# Patient Record
Sex: Male | Born: 1955 | Race: White | Hispanic: No | Smoking: Former smoker
Health system: Southern US, Community
[De-identification: ages and names within clinical notes are randomized; demographics above are authoritative.]

## PROBLEM LIST (undated history)

## (undated) DIAGNOSIS — M1990 Unspecified osteoarthritis, unspecified site: Secondary | ICD-10-CM

## (undated) DIAGNOSIS — F419 Anxiety disorder, unspecified: Secondary | ICD-10-CM

## (undated) DIAGNOSIS — S61219A Laceration without foreign body of unspecified finger without damage to nail, initial encounter: Secondary | ICD-10-CM

## (undated) DIAGNOSIS — N529 Male erectile dysfunction, unspecified: Secondary | ICD-10-CM

## (undated) DIAGNOSIS — I10 Essential (primary) hypertension: Secondary | ICD-10-CM

## (undated) DIAGNOSIS — F32A Depression, unspecified: Secondary | ICD-10-CM

## (undated) DIAGNOSIS — J683 Other acute and subacute respiratory conditions due to chemicals, gases, fumes and vapors: Secondary | ICD-10-CM

## (undated) DIAGNOSIS — J309 Allergic rhinitis, unspecified: Secondary | ICD-10-CM

## (undated) DIAGNOSIS — F329 Major depressive disorder, single episode, unspecified: Secondary | ICD-10-CM

## (undated) DIAGNOSIS — J449 Chronic obstructive pulmonary disease, unspecified: Secondary | ICD-10-CM

## (undated) DIAGNOSIS — C801 Malignant (primary) neoplasm, unspecified: Secondary | ICD-10-CM

## (undated) DIAGNOSIS — F439 Reaction to severe stress, unspecified: Secondary | ICD-10-CM

## (undated) DIAGNOSIS — I878 Other specified disorders of veins: Secondary | ICD-10-CM

## (undated) DIAGNOSIS — J189 Pneumonia, unspecified organism: Secondary | ICD-10-CM

## (undated) DIAGNOSIS — G4700 Insomnia, unspecified: Secondary | ICD-10-CM

## (undated) HISTORY — DX: Allergic rhinitis, unspecified: J30.9

## (undated) HISTORY — DX: Male erectile dysfunction, unspecified: N52.9

## (undated) HISTORY — DX: Anxiety disorder, unspecified: F41.9

## (undated) HISTORY — PX: ROTATOR CUFF REPAIR: SHX139

## (undated) HISTORY — DX: Essential (primary) hypertension: I10

## (undated) HISTORY — DX: Other acute and subacute respiratory conditions due to chemicals, gases, fumes and vapors: J68.3

## (undated) HISTORY — PX: OTHER SURGICAL HISTORY: SHX169

## (undated) HISTORY — DX: Insomnia, unspecified: G47.00

## (undated) HISTORY — DX: Reaction to severe stress, unspecified: F43.9

---

## 2006-12-15 HISTORY — PX: COLONOSCOPY: SHX174

## 2007-10-07 ENCOUNTER — Ambulatory Visit (HOSPITAL_COMMUNITY): Admission: RE | Admit: 2007-10-07 | Discharge: 2007-10-07 | Payer: Self-pay | Admitting: Internal Medicine

## 2007-10-07 ENCOUNTER — Ambulatory Visit: Payer: Self-pay | Admitting: Internal Medicine

## 2011-04-29 NOTE — Op Note (Signed)
NAME:  Ronald Juarez, Ronald Juarez                 ACCOUNT NO.:  [id number]   MEDICAL RECORD NO.:  [id number]          PATIENT TYPE:  AMB   LOCATION:  DAY                           FACILITY:  [facility]   PHYSICIAN:  R. Roetta Sessions, M.D. DATE OF BIRTH:  1956-10-24   DATE OF PROCEDURE:  10/07/2007  DATE OF DISCHARGE:                               OPERATIVE REPORT   PROCEDURE:  Colonoscopy screening.   INDICATIONS FOR PROCEDURE:  The patient is a 55 year old gentleman with  no lower GI tract symptoms sent over out of the courtesy of Dr. Lubertha South.  [name] has never his lower GI tract evaluated.  He has no  GI symptoms.  Family history is significant in that his half brother had  a laparotomy for colorectal cancer diagnosed at age 95.  Colonoscopy is  now being done as screening maneuver.  This approach has been discussed  with the patient at length.  Potential risks, benefits and alternatives  have been reviewed, questions were answered.  He is agreeable.  Please  see documentation on the medical record.   PROCEDURE NOTE:  O2 saturation, blood pressure, pulse and respirations  were monitored throughout the entire procedure.   CONSCIOUS SEDATION:  Versed 6 mg IV, Demerol 100 mg IV in divided doses.   INSTRUMENT:  Pentax video chip system.   FINDINGS:  Digital rectal exam revealed no abnormalities.   ENDOSCOPIC FINDINGS:  The prep was adequate.   Colon:  Colonic mucosa was surveyed from rectosigmoid junction through  the left, transverse and right colon to the area of appendiceal orifice,  ileocecal valve and cecum.  These structures were well seen and  photographed for the record.  From this level the scope was slowly  withdrawn.  All previously mentioned mucosal surfaces were again seen.  Careful examination of the mucosa utilizing tip flexion and full  flattening to see the mucosa very well.  The patient was noted to have  left-sided diverticula.  Remainder of colonic mucosa appeared  normal.  Scope was pulled down in the rectum where thorough examination of the  rectal mucosa including retroflexed view of the anal verge demonstrated  no abnormalities.  The patient tolerated the procedure well as reactive  to endoscopy.   IMPRESSION:  1. Normal rectum.  2. Left-sided diverticula.  Remainder of colonic mucosa appeared      normal.   RECOMMENDATIONS:  1. Diverticulosis literature given to [name].  2. Would consider having him return for screening colonoscopy somewhat      early, i.e., five years from now.      Jonathon Bellows, M.D.  Electronically Signed     RMR/MEDQ  D:  10/07/2007  T:  10/08/2007  Job:  [id number]   cc:   Lorin Picket A. Gerda Diss, MD  Fax: [phone]

## 2012-09-02 ENCOUNTER — Encounter: Payer: Self-pay | Admitting: *Deleted

## 2013-02-23 ENCOUNTER — Encounter: Payer: Self-pay | Admitting: *Deleted

## 2013-02-26 ENCOUNTER — Encounter: Payer: Self-pay | Admitting: *Deleted

## 2013-03-01 ENCOUNTER — Encounter: Payer: Self-pay | Admitting: Family Medicine

## 2013-03-02 ENCOUNTER — Encounter: Payer: Self-pay | Admitting: Family Medicine

## 2013-03-07 ENCOUNTER — Encounter: Payer: Self-pay | Admitting: Family Medicine

## 2013-03-07 ENCOUNTER — Ambulatory Visit (INDEPENDENT_AMBULATORY_CARE_PROVIDER_SITE_OTHER): Payer: 59 | Admitting: Family Medicine

## 2013-03-07 VITALS — BP 122/70 | HR 70 | Ht 72.0 in | Wt 253.0 lb

## 2013-03-07 DIAGNOSIS — F329 Major depressive disorder, single episode, unspecified: Secondary | ICD-10-CM

## 2013-03-07 DIAGNOSIS — Z125 Encounter for screening for malignant neoplasm of prostate: Secondary | ICD-10-CM

## 2013-03-07 DIAGNOSIS — I10 Essential (primary) hypertension: Secondary | ICD-10-CM

## 2013-03-07 DIAGNOSIS — J309 Allergic rhinitis, unspecified: Secondary | ICD-10-CM

## 2013-03-07 DIAGNOSIS — J45909 Unspecified asthma, uncomplicated: Secondary | ICD-10-CM

## 2013-03-07 DIAGNOSIS — N529 Male erectile dysfunction, unspecified: Secondary | ICD-10-CM

## 2013-03-07 DIAGNOSIS — R5381 Other malaise: Secondary | ICD-10-CM

## 2013-03-07 DIAGNOSIS — F411 Generalized anxiety disorder: Secondary | ICD-10-CM

## 2013-03-07 DIAGNOSIS — F32A Depression, unspecified: Secondary | ICD-10-CM

## 2013-03-07 DIAGNOSIS — E785 Hyperlipidemia, unspecified: Secondary | ICD-10-CM

## 2013-03-07 LAB — BASIC METABOLIC PANEL
Calcium: 9.4 mg/dL (ref 8.4–10.5)
Sodium: 139 mEq/L (ref 135–145)

## 2013-03-07 LAB — HEPATIC FUNCTION PANEL
AST: 20 U/L (ref 0–37)
Albumin: 4.5 g/dL (ref 3.5–5.2)
Alkaline Phosphatase: 66 U/L (ref 39–117)
Bilirubin, Direct: 0.1 mg/dL (ref 0.0–0.3)
Total Bilirubin: 0.5 mg/dL (ref 0.3–1.2)

## 2013-03-07 LAB — LIPID PANEL
HDL: 39 mg/dL — ABNORMAL LOW (ref >39)
LDL Cholesterol: 104 mg/dL — ABNORMAL HIGH (ref 0–99)
Total CHOL/HDL Ratio: 4.3 Ratio

## 2013-03-07 MED ORDER — VALACYCLOVIR HCL 500 MG PO TABS: ORAL_TABLET | ORAL | Status: DC

## 2013-03-07 MED ORDER — AMLODIPINE BESY-BENAZEPRIL HCL 10-20 MG PO CAPS: 1.0000 | ORAL_CAPSULE | Freq: Every day | ORAL | Status: DC

## 2013-03-07 MED ORDER — SILDENAFIL CITRATE 100 MG PO TABS: 100.0000 mg | ORAL_TABLET | Freq: Every day | ORAL | Status: DC | PRN

## 2013-03-07 MED ORDER — ALBUTEROL SULFATE HFA 108 (90 BASE) MCG/ACT IN AERS
2.0000 | INHALATION_SPRAY | Freq: Four times a day (QID) | RESPIRATORY_TRACT | Status: DC | PRN
Start: 2013-03-07 — End: 2014-03-27

## 2013-03-07 MED ORDER — BUPROPION HCL ER (SR) 150 MG PO TB12: 150.0000 mg | ORAL_TABLET | Freq: Two times a day (BID) | ORAL | Status: DC

## 2013-03-07 NOTE — Progress Notes (Signed)
  Subjective:    Patient ID: Ronald Juarez, male    DOB: 05/26/1956, 57 y.o.   MRN: [id number]  HPI  Taking all meds faithfully. Exercising regularly til weather got bad.energy level down. Gaining weight and frustrated. Still needs viagra for ed. Blood pressures are generally good 118 systolic usually.  Review of Systems  Constitutional: Positive for fatigue.  Respiratory: Negative.   Neurological: Negative for tremors.       Objective:   Physical Exam Alert no acute distress. HEENT normal. Lungs clear. Heart rare rhythm. Abdomen benign. Prostate normal. Extremities no edema pulses good. Neuro intact. Skin normal. Vitals reviewed.       Assessment & Plan:  Impression annual physical. #2 hypertension good control #3 reactive airways stable. #4 depression clinically stable. Plan as per orders check every 6 months.

## 2013-03-09 DIAGNOSIS — I10 Essential (primary) hypertension: Secondary | ICD-10-CM | POA: Insufficient documentation

## 2013-03-09 DIAGNOSIS — F411 Generalized anxiety disorder: Secondary | ICD-10-CM | POA: Insufficient documentation

## 2013-03-09 DIAGNOSIS — J309 Allergic rhinitis, unspecified: Secondary | ICD-10-CM | POA: Insufficient documentation

## 2013-03-11 ENCOUNTER — Other Ambulatory Visit: Payer: Self-pay | Admitting: Family Medicine

## 2013-03-24 ENCOUNTER — Telehealth: Payer: Self-pay | Admitting: Family Medicine

## 2013-03-24 NOTE — Telephone Encounter (Signed)
Patient would like to know if we can mail Blood Work results in the mail.

## 2013-03-25 NOTE — Telephone Encounter (Signed)
What bw results? Did he mean bw request?

## 2013-03-25 NOTE — Telephone Encounter (Signed)
Yes send copy of all to patient. All blood work fine except borderline cholesterol results.

## 2013-03-25 NOTE — Telephone Encounter (Signed)
Blood work results from 03/07/13 listed under labs in chart review.

## 2013-03-25 NOTE — Telephone Encounter (Signed)
Results mailed to patient;.

## 2013-06-29 ENCOUNTER — Ambulatory Visit (INDEPENDENT_AMBULATORY_CARE_PROVIDER_SITE_OTHER): Payer: 59 | Admitting: Family Medicine

## 2013-06-29 ENCOUNTER — Encounter: Payer: Self-pay | Admitting: Family Medicine

## 2013-06-29 VITALS — BP 138/82 | Wt 259.8 lb

## 2013-06-29 DIAGNOSIS — R21 Rash and other nonspecific skin eruption: Secondary | ICD-10-CM

## 2013-06-29 MED ORDER — EPINEPHRINE 0.3 MG/0.3ML IJ SOAJ: 0.3000 mg | Freq: Once | INTRAMUSCULAR | Status: DC

## 2013-06-29 MED ORDER — PREDNISONE 20 MG PO TABS: ORAL_TABLET | ORAL | Status: DC

## 2013-06-29 NOTE — Progress Notes (Signed)
  Subjective:    Patient ID: Ronald Juarez, male    DOB: September 16, 1956, 57 y.o.   MRN: [id number]  HPI Set of stings few wks ago with very sig erythroderma, itching, and upper lip swelled up.  No sob no cp then.  Stung ear and arms. Sig swelling with itching and swelling left forearm and right ear.  Deep burning pain Review of Systems No trouble breathing no swollen throat no wheezing ROS otherwise negative    Objective:   Physical Exam  Alert no acute distress lungs clear. Heart regular rate and rhythm. H&T normal. Left arm swelling significant.      Assessment & Plan:   impression significant local reaction secondary to stings angioedema one month ago plan EpiPen prescribed proper use discussed. Prednisone taper.

## 2013-07-28 ENCOUNTER — Ambulatory Visit (INDEPENDENT_AMBULATORY_CARE_PROVIDER_SITE_OTHER): Payer: 59 | Admitting: Family Medicine

## 2013-07-28 ENCOUNTER — Encounter: Payer: Self-pay | Admitting: Family Medicine

## 2013-07-28 VITALS — BP 144/98 | Ht 73.5 in | Wt 261.0 lb

## 2013-07-28 DIAGNOSIS — F411 Generalized anxiety disorder: Secondary | ICD-10-CM

## 2013-07-28 DIAGNOSIS — J683 Other acute and subacute respiratory conditions due to chemicals, gases, fumes and vapors: Secondary | ICD-10-CM

## 2013-07-28 DIAGNOSIS — S86911A Strain of unspecified muscle(s) and tendon(s) at lower leg level, right leg, initial encounter: Secondary | ICD-10-CM

## 2013-07-28 DIAGNOSIS — I10 Essential (primary) hypertension: Secondary | ICD-10-CM

## 2013-07-28 DIAGNOSIS — F329 Major depressive disorder, single episode, unspecified: Secondary | ICD-10-CM

## 2013-07-28 DIAGNOSIS — J45909 Unspecified asthma, uncomplicated: Secondary | ICD-10-CM

## 2013-07-28 DIAGNOSIS — F32A Depression, unspecified: Secondary | ICD-10-CM

## 2013-07-28 MED ORDER — BUPROPION HCL ER (SR) 150 MG PO TB12: 150.0000 mg | ORAL_TABLET | Freq: Two times a day (BID) | ORAL | Status: DC

## 2013-07-28 MED ORDER — SILDENAFIL CITRATE 100 MG PO TABS: ORAL_TABLET | ORAL | Status: DC

## 2013-07-28 MED ORDER — AMLODIPINE BESY-BENAZEPRIL HCL 10-20 MG PO CAPS: 1.0000 | ORAL_CAPSULE | Freq: Every day | ORAL | Status: DC

## 2013-07-28 MED ORDER — IBUPROFEN 800 MG PO TABS: 800.0000 mg | ORAL_TABLET | Freq: Two times a day (BID) | ORAL | Status: DC | PRN

## 2013-07-28 MED ORDER — VALACYCLOVIR HCL 500 MG PO TABS: ORAL_TABLET | ORAL | Status: DC

## 2013-07-28 NOTE — Progress Notes (Signed)
  Subjective:    Patient ID: Ronald Juarez, male    DOB: 1956-09-10, 57 y.o.   MRN: [id number]  Hypertension This is a chronic problem. The current episode started more than 1 year ago. The problem is uncontrolled. Past treatments include ACE inhibitors and calcium channel blockers. The current treatment provides moderate improvement. Compliance problems include exercise.    Exercising with the job, routine has fell offf  Anxiety overall under decent control, compliant with medications. Definitely helping.  Knee squatting down, picke up a large tire, felt a pain. Took med and it helped, took some ibuprofen.  Still uses an inhaler intermittently but not every day Review of Systems No chest pain no abdominal pain no back pain no change in bowel habits    Objective:   Physical Exam Alert no acute distress. Blood pressure good on repeat 136/86. Lungs clear. Heart regular rate and rhythm. Right knee slight crepitations no effusion no joint laxity no point tenderness good range of motion ankles without edema  Mental status stable no excessive anxiety or depression       Assessment & Plan:  Impression #1 hypertension good control. #2 reactive airways ongoing encouraged to stop smoking. #3 right knee strain highly doubt significant internal injury no need for mega-workup at this time. #4 anxiety depression clinically stable. Plan stop smoking diet exercise discussed. Maintain same meds. Ibuprofen twice a day when necessary for knee pain. Symptomatic care discussed. WSL

## 2013-12-30 ENCOUNTER — Other Ambulatory Visit: Payer: Self-pay | Admitting: Family Medicine

## 2014-02-26 ENCOUNTER — Other Ambulatory Visit: Payer: Self-pay | Admitting: Family Medicine

## 2014-03-01 ENCOUNTER — Other Ambulatory Visit: Payer: Self-pay | Admitting: Family Medicine

## 2014-03-01 NOTE — Telephone Encounter (Signed)
One mo only late for 6 mo ck up

## 2014-03-01 NOTE — Telephone Encounter (Signed)
Last seen 8/14

## 2014-03-13 ENCOUNTER — Encounter: Payer: Self-pay | Admitting: Family Medicine

## 2014-03-13 ENCOUNTER — Ambulatory Visit (INDEPENDENT_AMBULATORY_CARE_PROVIDER_SITE_OTHER): Payer: 59 | Admitting: Family Medicine

## 2014-03-13 VITALS — BP 132/88 | Ht 72.5 in | Wt 264.1 lb

## 2014-03-13 DIAGNOSIS — E785 Hyperlipidemia, unspecified: Secondary | ICD-10-CM

## 2014-03-13 DIAGNOSIS — Z125 Encounter for screening for malignant neoplasm of prostate: Secondary | ICD-10-CM

## 2014-03-13 DIAGNOSIS — I10 Essential (primary) hypertension: Secondary | ICD-10-CM

## 2014-03-13 DIAGNOSIS — Z79899 Other long term (current) drug therapy: Secondary | ICD-10-CM

## 2014-03-13 DIAGNOSIS — F411 Generalized anxiety disorder: Secondary | ICD-10-CM

## 2014-03-13 DIAGNOSIS — Z0000 Encounter for general adult medical examination without abnormal findings: Secondary | ICD-10-CM

## 2014-03-13 DIAGNOSIS — J309 Allergic rhinitis, unspecified: Secondary | ICD-10-CM

## 2014-03-13 LAB — HEPATIC FUNCTION PANEL
ALBUMIN: 4.4 g/dL (ref 3.5–5.2)
ALK PHOS: 69 U/L (ref 39–117)
ALT: 31 U/L (ref 0–53)
AST: 21 U/L (ref 0–37)
BILIRUBIN DIRECT: 0.1 mg/dL (ref 0.0–0.3)
BILIRUBIN TOTAL: 0.5 mg/dL (ref 0.2–1.2)
Indirect Bilirubin: 0.4 mg/dL (ref 0.2–1.2)
Total Protein: 7 g/dL (ref 6.0–8.3)

## 2014-03-13 LAB — BASIC METABOLIC PANEL
BUN: 10 mg/dL (ref 6–23)
CHLORIDE: 102 meq/L (ref 96–112)
CO2: 30 meq/L (ref 19–32)
CREATININE: 0.81 mg/dL (ref 0.50–1.35)
Calcium: 9.3 mg/dL (ref 8.4–10.5)
GLUCOSE: 90 mg/dL (ref 70–99)
Potassium: 4.6 mEq/L (ref 3.5–5.3)
SODIUM: 141 meq/L (ref 135–145)

## 2014-03-13 LAB — LIPID PANEL
Cholesterol: 174 mg/dL (ref 0–200)
HDL: 49 mg/dL (ref >39)
LDL CALC: 101 mg/dL — AB (ref 0–99)
Total CHOL/HDL Ratio: 3.6 Ratio
Triglycerides: 122 mg/dL (ref <150)
VLDL: 24 mg/dL (ref 0–40)

## 2014-03-13 MED ORDER — AMLODIPINE BESY-BENAZEPRIL HCL 10-20 MG PO CAPS: ORAL_CAPSULE | ORAL | Status: DC

## 2014-03-13 MED ORDER — BUPROPION HCL ER (SR) 150 MG PO TB12: ORAL_TABLET | ORAL | Status: DC

## 2014-03-13 NOTE — Progress Notes (Signed)
   Subjective:    Patient ID: Ronald Juarez, male    DOB: 1956-04-27, 58 y.o.   MRN: [id number]  HPI Patient is here for his annual wellness exam. Patient has some spots on his arms that he thinks may be cancerous. Patient states he is having some swelling in his ankles also.  Swelling tends to occur with protracted sitting  Compliant with meds.  amplodipine seems to cause the swelling  Exercise some not a lot.  Not enough time to do it.  Stays active at work.  Diet not the best  Due for colonoscopy this yr, pt will sched,      Review of Systems  Constitutional: Negative for fever, activity change and appetite change.  HENT: Negative for congestion and rhinorrhea.   Eyes: Negative for discharge.  Respiratory: Negative for cough and wheezing.   Cardiovascular: Negative for chest pain.  Gastrointestinal: Negative for vomiting, abdominal pain and blood in stool.  Genitourinary: Negative for frequency and difficulty urinating.  Musculoskeletal: Negative for neck pain.  Skin: Negative for rash.  Allergic/Immunologic: Negative for environmental allergies and food allergies.  Neurological: Negative for weakness and headaches.  Psychiatric/Behavioral: Negative for agitation.  All other systems reviewed and are negative.       Objective:   Physical Exam  Vitals reviewed. Constitutional: He appears well-developed and well-nourished.  HENT:  Head: Normocephalic and atraumatic.  Right Ear: External ear normal.  Left Ear: External ear normal.  Nose: Nose normal.  Mouth/Throat: Oropharynx is clear and moist.  Eyes: EOM are normal. Pupils are equal, round, and reactive to light.  Neck: Normal range of motion. Neck supple. No thyromegaly present.  Cardiovascular: Normal rate, regular rhythm and normal heart sounds.   No murmur heard. Pulmonary/Chest: Effort normal and breath sounds normal. No respiratory distress. He has no wheezes.  Abdominal: Soft. Bowel sounds are normal.  He exhibits no distension and no mass. There is no tenderness.  Genitourinary: Penis normal.  Musculoskeletal: Normal range of motion. He exhibits no edema.  Ankles 1+ edema at most. Chronic venous stasis changes noted  Lymphadenopathy:    He has no cervical adenopathy.  Neurological: He is alert. He exhibits normal muscle tone.  Skin: Skin is warm and dry. No erythema.  Skin several normal seborrheic keratoses evident  Psychiatric: He has a normal mood and affect. His behavior is normal. Judgment normal.          Assessment & Plan:  Impression 1 wellness exam. #2 hypertension clinically stable. #3 venous stasis discussed. #4 allergic rhinitis stable. #5 seborrheic keratosis patient reassured. Plan medications refilled. Diet exercise discussed in encourage. Appropriate blood work. Patient to work on setting up his own colonoscopy. WSL recheck every 6 months

## 2014-03-14 LAB — PSA: PSA: 0.95 ng/mL (ref <=4.00)

## 2014-03-26 ENCOUNTER — Other Ambulatory Visit: Payer: Self-pay | Admitting: Family Medicine

## 2014-03-26 ENCOUNTER — Encounter: Payer: Self-pay | Admitting: Family Medicine

## 2014-03-27 ENCOUNTER — Telehealth: Payer: Self-pay | Admitting: Family Medicine

## 2014-03-27 ENCOUNTER — Other Ambulatory Visit: Payer: Self-pay | Admitting: Family Medicine

## 2014-03-27 DIAGNOSIS — J45909 Unspecified asthma, uncomplicated: Secondary | ICD-10-CM

## 2014-03-27 MED ORDER — ALBUTEROL SULFATE HFA 108 (90 BASE) MCG/ACT IN AERS: 2.0000 | INHALATION_SPRAY | Freq: Four times a day (QID) | RESPIRATORY_TRACT | Status: DC | PRN

## 2014-03-27 MED ORDER — BUPROPION HCL ER (SR) 150 MG PO TB12: ORAL_TABLET | ORAL | Status: DC

## 2014-03-27 MED ORDER — ALPRAZOLAM 0.5 MG PO TABS: ORAL_TABLET | ORAL | Status: DC

## 2014-03-27 MED ORDER — AMLODIPINE BESY-BENAZEPRIL HCL 10-20 MG PO CAPS: ORAL_CAPSULE | ORAL | Status: DC

## 2014-03-27 NOTE — Telephone Encounter (Signed)
Rx's sent to pharmacy. Patient notified. 

## 2014-03-27 NOTE — Telephone Encounter (Signed)
Every single one of your prescriptions needs to be refilled from his OV on 03/13/14 He went to [facility] over the weekend expecting to pick up all his scripts to be told they Did not have any of them.   I tried to decipher with he needed to which he told me ALL of them.   [facility]

## 2014-04-30 ENCOUNTER — Other Ambulatory Visit: Payer: Self-pay | Admitting: Family Medicine

## 2014-08-22 ENCOUNTER — Other Ambulatory Visit: Payer: Self-pay | Admitting: Family Medicine

## 2014-10-19 ENCOUNTER — Other Ambulatory Visit: Payer: Self-pay | Admitting: Family Medicine

## 2014-10-19 NOTE — Telephone Encounter (Signed)
Last seen 03/13/14

## 2014-10-24 ENCOUNTER — Encounter: Payer: Self-pay | Admitting: Family Medicine

## 2014-10-24 ENCOUNTER — Ambulatory Visit (INDEPENDENT_AMBULATORY_CARE_PROVIDER_SITE_OTHER): Payer: 59 | Admitting: Family Medicine

## 2014-10-24 VITALS — BP 132/86 | Ht 72.5 in | Wt 267.4 lb

## 2014-10-24 DIAGNOSIS — I10 Essential (primary) hypertension: Secondary | ICD-10-CM

## 2014-10-24 DIAGNOSIS — F411 Generalized anxiety disorder: Secondary | ICD-10-CM

## 2014-10-24 DIAGNOSIS — Z23 Encounter for immunization: Secondary | ICD-10-CM

## 2014-10-24 MED ORDER — ALPRAZOLAM 0.5 MG PO TABS: ORAL_TABLET | ORAL | Status: DC

## 2014-10-24 MED ORDER — AMLODIPINE BESY-BENAZEPRIL HCL 10-20 MG PO CAPS: ORAL_CAPSULE | ORAL | Status: DC

## 2014-10-24 NOTE — Progress Notes (Signed)
   Subjective:    Patient ID: Ronald Juarez, male    DOB: 1956/02/07, 58 y.o.   MRN: [id number]  Hypertension This is a chronic problem. Risk factors for coronary artery disease include male gender and obesity. Treatments tried: lotrel. There are no compliance problems.    Needs refills on Lotrel , exercising frequently. Multi times per wk  Stayed active with job,  Wheezing better , flares up with allergies some, Using vent off and on  Patient unfortunately still smoking.  Compliant with his Xanax. States it definitely helps him rest at nighttime.  Patient notes ongoing stress and tendency towards depression. Wellbutrin has helped that. No obvious side effects.  Ongoing challenges with erectile dysfunction. Compliant with medication and it does help.       Review of Systems No headache no chest pain some back pain no abdominal pain some reflux on occasion no change in bowel habits no blood in stool ROS otherwise negative    Objective:   Physical Exam  Alert no apparent distress. HEENT normal. Neck supple. Lungs clear. Heart regular in rhythm. Ankles without edema.      Assessment & Plan:  Impression 1 hypertension good control. #2 insomnia with element of anxiety ongoing discuss patient needs meds. #3 depression clinically stable #4 erectile dysfunction stable. #5 history of reactive airways with unfortunate smoking habit plan strongly encouraged to stop smoking. Medications refilled. Diet exercise discussed.follow-up in 6 months. WSL

## 2015-03-16 ENCOUNTER — Other Ambulatory Visit: Payer: Self-pay | Admitting: Family Medicine

## 2015-03-28 ENCOUNTER — Ambulatory Visit (INDEPENDENT_AMBULATORY_CARE_PROVIDER_SITE_OTHER): Payer: 59 | Admitting: Family Medicine

## 2015-03-28 ENCOUNTER — Encounter: Payer: Self-pay | Admitting: Family Medicine

## 2015-03-28 VITALS — BP 120/80 | Ht 72.5 in | Wt 272.4 lb

## 2015-03-28 DIAGNOSIS — Z125 Encounter for screening for malignant neoplasm of prostate: Secondary | ICD-10-CM

## 2015-03-28 DIAGNOSIS — E785 Hyperlipidemia, unspecified: Secondary | ICD-10-CM

## 2015-03-28 DIAGNOSIS — Z79899 Other long term (current) drug therapy: Secondary | ICD-10-CM

## 2015-03-28 DIAGNOSIS — Z0000 Encounter for general adult medical examination without abnormal findings: Secondary | ICD-10-CM | POA: Diagnosis not present

## 2015-03-28 MED ORDER — BUPROPION HCL ER (SR) 150 MG PO TB12: 150.0000 mg | ORAL_TABLET | Freq: Two times a day (BID) | ORAL | Status: DC

## 2015-03-28 MED ORDER — AMLODIPINE BESY-BENAZEPRIL HCL 10-20 MG PO CAPS: ORAL_CAPSULE | ORAL | Status: DC

## 2015-03-28 MED ORDER — ALPRAZOLAM 0.5 MG PO TABS: ORAL_TABLET | ORAL | Status: DC

## 2015-03-28 NOTE — Progress Notes (Signed)
   Subjective:    Patient ID: Ronald Juarez, male    DOB: [birth year], 59 y.o.   MRN: [id number]  HPI The patient comes in today for a wellness visit.  Exercising not much but doing a lot of walking,  Not exercising much these days  Pt also admits to not the best eating    A review of their health history was completed.  A review of medications was also completed.  Any needed refills: xanax  Eating habits: ok  Falls/  MVA accidents in past few months: none  Regular exercise: walking   Specialist pt sees on regular basis: none  Preventative health issues were discussed.   Additional concerns: none   Review of Systems  Constitutional: Negative for fever, activity change and appetite change.  HENT: Negative for congestion and rhinorrhea.   Eyes: Negative for discharge.  Respiratory: Negative for cough and wheezing.   Cardiovascular: Negative for chest pain.  Gastrointestinal: Negative for vomiting, abdominal pain and blood in stool.  Genitourinary: Negative for frequency and difficulty urinating.  Musculoskeletal: Negative for neck pain.  Skin: Negative for rash.  Allergic/Immunologic: Negative for environmental allergies and food allergies.  Neurological: Negative for weakness and headaches.  Psychiatric/Behavioral: Negative for agitation.  All other systems reviewed and are negative.      Objective:   Physical Exam  Constitutional: He appears well-developed and well-nourished.  HENT:  Head: Normocephalic and atraumatic.  Right Ear: External ear normal.  Left Ear: External ear normal.  Nose: Nose normal.  Mouth/Throat: Oropharynx is clear and moist.  Eyes: EOM are normal. Pupils are equal, round, and reactive to light.  Neck: Normal range of motion. Neck supple. No thyromegaly present.  Cardiovascular: Normal rate, regular rhythm and normal heart sounds.   No murmur heard. Pulmonary/Chest: Effort normal and breath sounds normal. No respiratory distress. He  has no wheezes.  Abdominal: Soft. Bowel sounds are normal. He exhibits no distension and no mass. There is no tenderness.  Genitourinary: Prostate normal and penis normal.  Musculoskeletal: Normal range of motion. He exhibits no edema.  Lymphadenopathy:    He has no cervical adenopathy.  Neurological: He is alert. He exhibits normal muscle tone.  Skin: Skin is warm and dry. No erythema.  Psychiatric: He has a normal mood and affect. His behavior is normal. Judgment normal.  Vitals reviewed.         Assessment & Plan:  Impression wellness exam of note chronic medications were refilled but the vast majority of time was spent on preventative matters. Plan diet discussed. Exercise discussed. Appropriate blood work. Recheck in 6 months. WSL

## 2015-03-29 LAB — HEPATIC FUNCTION PANEL
ALBUMIN: 4.5 g/dL (ref 3.5–5.5)
ALT: 30 IU/L (ref 0–44)
AST: 24 IU/L (ref 0–40)
Alkaline Phosphatase: 86 IU/L (ref 39–117)
Bilirubin Total: 0.4 mg/dL (ref 0.0–1.2)
Bilirubin, Direct: 0.1 mg/dL (ref 0.00–0.40)
Total Protein: 7.2 g/dL (ref 6.0–8.5)

## 2015-03-29 LAB — BASIC METABOLIC PANEL
BUN / CREAT RATIO: 14 (ref 9–20)
BUN: 13 mg/dL (ref 6–24)
CALCIUM: 9.5 mg/dL (ref 8.7–10.2)
CO2: 28 mmol/L (ref 18–29)
CREATININE: 0.92 mg/dL (ref 0.76–1.27)
Chloride: 98 mmol/L (ref 97–108)
GFR calc Af Amer: 106 mL/min/{1.73_m2} (ref >59)
GFR calc non Af Amer: 91 mL/min/{1.73_m2} (ref >59)
Glucose: 104 mg/dL — ABNORMAL HIGH (ref 65–99)
Potassium: 5.2 mmol/L (ref 3.5–5.2)
Sodium: 140 mmol/L (ref 134–144)

## 2015-03-29 LAB — LIPID PANEL
CHOL/HDL RATIO: 4.7 ratio (ref 0.0–5.0)
CHOLESTEROL TOTAL: 214 mg/dL — AB (ref 100–199)
HDL: 46 mg/dL (ref >39)
LDL Calculated: 127 mg/dL — ABNORMAL HIGH (ref 0–99)
TRIGLYCERIDES: 207 mg/dL — AB (ref 0–149)
VLDL CHOLESTEROL CAL: 41 mg/dL — AB (ref 5–40)

## 2015-03-29 LAB — PSA: PSA: 0.9 ng/mL (ref 0.0–4.0)

## 2015-03-30 ENCOUNTER — Encounter: Payer: Self-pay | Admitting: Family Medicine

## 2015-05-02 ENCOUNTER — Other Ambulatory Visit: Payer: Self-pay | Admitting: Family Medicine

## 2015-07-26 ENCOUNTER — Encounter: Payer: Self-pay | Admitting: Family Medicine

## 2015-07-26 ENCOUNTER — Ambulatory Visit (INDEPENDENT_AMBULATORY_CARE_PROVIDER_SITE_OTHER): Payer: Commercial Managed Care - HMO | Admitting: Family Medicine

## 2015-07-26 VITALS — BP 120/72 | Ht 72.5 in | Wt 260.8 lb

## 2015-07-26 DIAGNOSIS — J189 Pneumonia, unspecified organism: Secondary | ICD-10-CM

## 2015-07-26 MED ORDER — AMOXICILLIN-POT CLAVULANATE 875-125 MG PO TABS: 1.0000 | ORAL_TABLET | Freq: Two times a day (BID) | ORAL | Status: DC

## 2015-07-26 MED ORDER — PREDNISONE 20 MG PO TABS: ORAL_TABLET | ORAL | Status: DC

## 2015-07-26 NOTE — Progress Notes (Signed)
   Subjective:    Patient ID: Ronald Juarez, male    DOB: 06/04/1956, 59 y.o.   MRN: [id number]  Cough This is a new problem. The current episode started 1 to 4 weeks ago. Associated symptoms include nasal congestion, rhinorrhea and wheezing. Pertinent negatives include no chest pain, ear pain or fever. Treatments tried: just finished prednisone and antibiotic from urgent  care.   Went to urgent care about 2 weeks ago,xray pneumonia Review of Systems  Constitutional: Negative for fever and activity change.  HENT: Positive for congestion and rhinorrhea. Negative for ear pain.   Eyes: Negative for discharge.  Respiratory: Positive for cough and wheezing.   Cardiovascular: Negative for chest pain.       Objective:   Physical Exam  Constitutional: He appears well-developed.  HENT:  Head: Normocephalic.  Mouth/Throat: Oropharynx is clear and moist. No oropharyngeal exudate.  Neck: Normal range of motion.  Cardiovascular: Normal rate, regular rhythm and normal heart sounds.   No murmur heard. Pulmonary/Chest: Effort normal. He has wheezes.  Lymphadenopathy:    He has no cervical adenopathy.  Neurological: He exhibits normal muscle tone.  Skin: Skin is warm and dry.  Nursing note and vitals reviewed.         Assessment & Plan:  I discussed with the patient the importance of getting his chest x-ray in approximately 3-4 weeks. Patient quit smoking he was encouraged to stay away from smoking Additional round of antibiotics and prednisone Warning signs were discussed Follow-up if progressive troubles skin necessary currently but requested records was made on previous x-ray made at urgent care plus also if future x-ray does not show total clearing will need further testing patient was told the importance of this

## 2015-08-10 ENCOUNTER — Encounter (HOSPITAL_COMMUNITY): Payer: Self-pay | Admitting: *Deleted

## 2015-08-10 ENCOUNTER — Emergency Department (HOSPITAL_COMMUNITY): Payer: Commercial Managed Care - HMO

## 2015-08-10 ENCOUNTER — Emergency Department (HOSPITAL_COMMUNITY)
Admission: EM | Admit: 2015-08-10 | Discharge: 2015-08-10 | Disposition: A | Discharge status: Home / Self Care | Payer: Commercial Managed Care - HMO | Attending: Emergency Medicine | Admitting: Emergency Medicine

## 2015-08-10 ENCOUNTER — Telehealth: Payer: Self-pay | Admitting: Family Medicine

## 2015-08-10 DIAGNOSIS — F419 Anxiety disorder, unspecified: Secondary | ICD-10-CM | POA: Insufficient documentation

## 2015-08-10 DIAGNOSIS — Z8669 Personal history of other diseases of the nervous system and sense organs: Secondary | ICD-10-CM | POA: Diagnosis not present

## 2015-08-10 DIAGNOSIS — J45901 Unspecified asthma with (acute) exacerbation: Secondary | ICD-10-CM | POA: Diagnosis not present

## 2015-08-10 DIAGNOSIS — Z79899 Other long term (current) drug therapy: Secondary | ICD-10-CM | POA: Diagnosis not present

## 2015-08-10 DIAGNOSIS — J069 Acute upper respiratory infection, unspecified: Secondary | ICD-10-CM | POA: Insufficient documentation

## 2015-08-10 DIAGNOSIS — N529 Male erectile dysfunction, unspecified: Secondary | ICD-10-CM | POA: Insufficient documentation

## 2015-08-10 DIAGNOSIS — I10 Essential (primary) hypertension: Secondary | ICD-10-CM | POA: Diagnosis not present

## 2015-08-10 DIAGNOSIS — Z8701 Personal history of pneumonia (recurrent): Secondary | ICD-10-CM | POA: Insufficient documentation

## 2015-08-10 DIAGNOSIS — Z87891 Personal history of nicotine dependence: Secondary | ICD-10-CM | POA: Diagnosis not present

## 2015-08-10 DIAGNOSIS — Z7982 Long term (current) use of aspirin: Secondary | ICD-10-CM | POA: Insufficient documentation

## 2015-08-10 DIAGNOSIS — J40 Bronchitis, not specified as acute or chronic: Secondary | ICD-10-CM

## 2015-08-10 DIAGNOSIS — R0602 Shortness of breath: Secondary | ICD-10-CM | POA: Diagnosis present

## 2015-08-10 HISTORY — DX: Pneumonia, unspecified organism: J18.9

## 2015-08-10 MED ORDER — DM-GUAIFENESIN ER 30-600 MG PO TB12: 1.0000 | ORAL_TABLET | Freq: Two times a day (BID) | ORAL | Status: DC

## 2015-08-10 NOTE — Telephone Encounter (Signed)
Reviewed and discussed message in real time with Dr.Scott. Dr.Scott stated that with patient having complaints of SOB, and being fatigue he recommends that the patient go to ER or [facility] today because he needs to be evaluated tonight and have a possible chest xray. Spoke with patient's wife and informed her of Dr.Scott's advice and that Dr.Scott advised to be seen at the ER today. Patient's wife verbalized understanding.

## 2015-08-10 NOTE — ED Notes (Signed)
Pt dx with PNA 2 weeks ago and has finished two rounds of antibiotics, Dr. Wolfgang Phoenix insisted pt come here for evaluation, + SOB, + cough productive yellow to clear in color

## 2015-08-10 NOTE — ED Provider Notes (Signed)
CSN: [id number]     Arrival date & time 08/10/15  1719 History   First MD Initiated Contact with Patient 08/10/15 1750     Chief Complaint  Patient presents with  . Pneumonia     (Consider location/radiation/quality/duration/timing/severity/associated sxs/prior Treatment) Patient is a 59 y.o. male presenting with pneumonia. The history is provided by the patient.  Pneumonia Associated symptoms include shortness of breath. Pertinent negatives include no chest pain, no abdominal pain and no headaches.   patient with 2 week history of cough congestion and some shortness of breath cough is yellow to clear sputum. Patient felt worse during the first week had a chest x-ray that showed pneumonia treated with a Z-Pak and then started on Augmentin. Patient still with coughing slightly improved but not overall improved. Patient is concerned that he still has pneumonia.  Past Medical History  Diagnosis Date  . Anxiety   . Insomnia   . Stress   . Hypertension   . ED (erectile dysfunction)   . Allergic rhinitis   . Reactive airways dysfunction syndrome   . PNA (pneumonia)    Past Surgical History  Procedure Laterality Date  . Rotator cuff repair    . Pylondial cyst     History reviewed. No pertinent family history. Social History  Substance Use Topics  . Smoking status: Former Smoker -- 1.00 packs/day    Types: Cigarettes  . Smokeless tobacco: None  . Alcohol Use: 0.0 oz/week    0 Standard drinks or equivalent per week     Comment: occ. use    Review of Systems  Constitutional: Positive for fever.  HENT: Positive for congestion.   Eyes: Negative for visual disturbance.  Respiratory: Positive for cough and shortness of breath.   Cardiovascular: Negative for chest pain.  Gastrointestinal: Negative for nausea, vomiting, abdominal pain and diarrhea.  Genitourinary: Negative for dysuria.  Musculoskeletal: Negative for back pain.  Skin: Negative for rash.  Neurological: Negative for  headaches.  Hematological: Does not bruise/bleed easily.  Psychiatric/Behavioral: Negative for confusion.      Allergies  Bee venom; Hctz; and Lozol  Home Medications   Prior to Admission medications   Medication Sig Start Date End Date Taking? Authorizing Provider  ALPRAZolam (XANAX) 0.5 MG tablet TAKE 1/2-1 TAB BY MOUTH AT BEDTIME AS NEEDED Patient taking differently: Take 0.25-0.5 mg by mouth at bedtime as needed.  03/28/15  Yes Mikey Kirschner, MD  amLODipine-benazepril (LOTREL) 10-20 MG per capsule take 1 capsule by mouth once daily Patient taking differently: Take 1 capsule by mouth daily.  03/28/15  Yes Mikey Kirschner, MD  aspirin 81 MG tablet Take 81 mg by mouth daily.   Yes Historical Provider, MD  buPROPion (WELLBUTRIN SR) 150 MG 12 hr tablet Take 1 tablet (150 mg total) by mouth 2 (two) times daily. 03/28/15  Yes Mikey Kirschner, MD  EPINEPHrine (EPI-PEN) 0.3 mg/0.3 mL SOAJ Inject 0.3 mLs (0.3 mg total) into the muscle once. 06/29/13  Yes Mikey Kirschner, MD  PROAIR HFA 108 (90 BASE) MCG/ACT inhaler USE 2 PUFFS EVERY 6 HOURS AS NEEDED FOR WHEEZING. 05/02/15  Yes Mikey Kirschner, MD  valACYclovir (VALTREX) 500 MG tablet take 1 tablet by mouth twice a day FOR 3 DAYS DURING FLARE   Yes Mikey Kirschner, MD  VIAGRA 100 MG tablet TAKE 1 TABLET ONCE DAILY IF NEEDED 08/22/14  Yes Mikey Kirschner, MD  amoxicillin-clavulanate (AUGMENTIN) 875-125 MG per tablet Take 1 tablet by mouth 2 (two)  times daily. Patient not taking: Reported on 08/10/2015 07/26/15   Kathyrn Drown, MD  dextromethorphan-guaiFENesin [facility] DM) 30-600 MG per 12 hr tablet Take 1 tablet by mouth 2 (two) times daily. 08/10/15   Fredia Sorrow, MD   BP 139/85 mmHg  Pulse 82  Temp(Src) 97.7 F (36.5 C) (Oral)  Resp 18  Ht 6\' 1"  (1.854 m)  Wt 265 lb (120.203 kg)  BMI 34.97 kg/m2  SpO2 94% Physical Exam  Constitutional: He is oriented to person, place, and time. He appears well-developed and well-nourished. No  distress.  HENT:  Head: Normocephalic and atraumatic.  Mouth/Throat: Oropharynx is clear and moist.  Eyes: Conjunctivae and EOM are normal. Pupils are equal, round, and reactive to light.  Neck: Normal range of motion.  Cardiovascular: Normal rate and normal heart sounds.   No murmur heard. Pulmonary/Chest: Effort normal and breath sounds normal. No respiratory distress.  Abdominal: Soft. Bowel sounds are normal. There is no tenderness.  Musculoskeletal: Normal range of motion.  Neurological: He is alert and oriented to person, place, and time. No cranial nerve deficit. He exhibits normal muscle tone. Coordination normal.  Nursing note and vitals reviewed.   ED Course  Procedures (including critical care time) Labs Review Labs Reviewed - No data to display  Imaging Review Dg Chest 2 View  08/10/2015   CLINICAL DATA:  Cough for about 2 weeks, not improving. Shortness of breath. Smoker for 30 years but quit in March of this year.  EXAM: CHEST  2 VIEW  COMPARISON:  10/11/2011.  FINDINGS: Normal heart size and pulmonary vascularity. Mild hyperinflation suggesting emphysema. Central interstitial changes suggesting chronic bronchitis. No focal airspace disease or consolidation in the lungs. No blunting of costophrenic angles. No pneumothorax. Mediastinal contours appear intact.  IMPRESSION: Mild emphysematous and chronic bronchitic changes in the lungs. No evidence of active pulmonary disease.   Electronically Signed   By: Lucienne Capers M.D.   On: 08/10/2015 18:53   I have personally reviewed and evaluated these images and lab results as part of my medical decision-making.   EKG Interpretation None      MDM   Final diagnoses:  URI (upper respiratory infection)  Bronchitis    Patient with a two-week history of cough congestion somewhat productive cough. Was diagnosed with a community-acquired pneumonia 2 weeks ago was on a Z-Pak and then changed to Augmentin. She does not feel  significantly better and is concerned that the pneumonia has not resolved.  Chest x-ray here shows resolution of the pneumonia shows some evidence of bronchitis. Patient's continuous pulse ox is been 9394% on room air. Patient nontoxic no acute distress. Patient already using albuterol inhaler also start Mucinex DM. Proper patient work note for the next week but only wanted for a day. Patient will follow back up with his primary care doctor.    Fredia Sorrow, MD 08/10/15 [phone]

## 2015-08-10 NOTE — Telephone Encounter (Signed)
pts spouse calling to say that he is still not feeling any better, he has been through  Two rounds of antibiotics/prednisone and still having shortness of breath, tired,  Chest congestion, coughing (moderate), no fever    Would like a morning appt with you want to bring him back in   [facility]

## 2015-08-10 NOTE — Discharge Instructions (Signed)
Chest x-ray here today negative for evidence of pneumonia. Continue usual albuterol inhaler. Take the Mucinex DM as directed. 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 Return for any new or worse symptoms.

## 2015-08-23 ENCOUNTER — Other Ambulatory Visit: Payer: Self-pay | Admitting: Family Medicine

## 2015-08-27 ENCOUNTER — Ambulatory Visit (INDEPENDENT_AMBULATORY_CARE_PROVIDER_SITE_OTHER): Payer: Commercial Managed Care - HMO | Admitting: Family Medicine

## 2015-08-27 ENCOUNTER — Encounter: Payer: Self-pay | Admitting: Family Medicine

## 2015-08-27 VITALS — BP 136/84 | Ht 72.5 in | Wt 265.0 lb

## 2015-08-27 DIAGNOSIS — J683 Other acute and subacute respiratory conditions due to chemicals, gases, fumes and vapors: Secondary | ICD-10-CM

## 2015-08-27 DIAGNOSIS — J4520 Mild intermittent asthma, uncomplicated: Secondary | ICD-10-CM

## 2015-08-27 DIAGNOSIS — J441 Chronic obstructive pulmonary disease with (acute) exacerbation: Secondary | ICD-10-CM

## 2015-08-27 DIAGNOSIS — I10 Essential (primary) hypertension: Secondary | ICD-10-CM

## 2015-08-27 DIAGNOSIS — J449 Chronic obstructive pulmonary disease, unspecified: Secondary | ICD-10-CM | POA: Insufficient documentation

## 2015-08-27 MED ORDER — PREDNISONE 20 MG PO TABS: ORAL_TABLET | ORAL | Status: DC

## 2015-08-27 MED ORDER — FLUTICASONE-SALMETEROL 115-21 MCG/ACT IN AERO: 2.0000 | INHALATION_SPRAY | Freq: Two times a day (BID) | RESPIRATORY_TRACT | Status: DC

## 2015-08-27 MED ORDER — LEVOFLOXACIN 500 MG PO TABS: ORAL_TABLET | ORAL | Status: DC

## 2015-08-27 NOTE — Progress Notes (Signed)
   Subjective:    Patient ID: Ronald Juarez, male    DOB: 1956-02-18, 59 y.o.   MRN: [id number]  Cough This is a new problem. The current episode started more than 1 month ago. The problem has been gradually worsening. The cough is productive of sputum. Associated symptoms include rhinorrhea and wheezing. Exacerbated by: Heat. He has tried steroid inhaler and OTC cough suppressant for the symptoms. The treatment provided no relief.   Patient states no other concerns this visit .  Persistent cough and cong and wheeziness  Gets worse when out in the bad air  Difficulty coughing up things  Using inhaler  Not smoking since last p e  , unfortunately 30 year pack history of smoking  Review of Systems  HENT: Positive for rhinorrhea.   Respiratory: Positive for cough and wheezing.     no fever no chills    Objective:   Physical Exam  alert vitals stable. HEENT moderate nasal congestion. Pharynx normal lungs bilateral wheezes heart rare rhythm.    hospital records and x-ray reviewed and presence of patient.       Assessment & Plan:   impression 1 COPD new diagnosis discussed at length. Has ramifications currently and long-term.  Revealed on chest x-ray and consistent with 30 year pack history. No good reason for pulmonary function tests at this time. Discussed.As far as increased tendency to get into reactive airways. Also shortness breath. Importance of vaccines discussed. Importance of absolute not returning to smoking discussed #2 bronchitis with reactive airways plan prednisone taper. Augmentin twice a day 10 days. Albuterol 2 sprays 4 times a day. 25 minutes spent most in discussion WSL

## 2015-09-24 ENCOUNTER — Ambulatory Visit (INDEPENDENT_AMBULATORY_CARE_PROVIDER_SITE_OTHER): Payer: Commercial Managed Care - HMO | Admitting: Family Medicine

## 2015-09-24 ENCOUNTER — Encounter: Payer: Self-pay | Admitting: Family Medicine

## 2015-09-24 VITALS — BP 126/82 | Ht 72.5 in | Wt 273.0 lb

## 2015-09-24 DIAGNOSIS — J441 Chronic obstructive pulmonary disease with (acute) exacerbation: Secondary | ICD-10-CM

## 2015-09-24 DIAGNOSIS — Z23 Encounter for immunization: Secondary | ICD-10-CM

## 2015-09-24 DIAGNOSIS — I10 Essential (primary) hypertension: Secondary | ICD-10-CM

## 2015-09-24 DIAGNOSIS — F411 Generalized anxiety disorder: Secondary | ICD-10-CM

## 2015-09-24 DIAGNOSIS — I878 Other specified disorders of veins: Secondary | ICD-10-CM | POA: Insufficient documentation

## 2015-09-24 MED ORDER — ALBUTEROL SULFATE HFA 108 (90 BASE) MCG/ACT IN AERS: INHALATION_SPRAY | RESPIRATORY_TRACT | Status: DC

## 2015-09-24 MED ORDER — BUPROPION HCL ER (SR) 150 MG PO TB12: 150.0000 mg | ORAL_TABLET | Freq: Two times a day (BID) | ORAL | Status: DC

## 2015-09-24 MED ORDER — AMLODIPINE BESY-BENAZEPRIL HCL 10-20 MG PO CAPS: ORAL_CAPSULE | ORAL | Status: DC

## 2015-09-24 MED ORDER — FLUTICASONE-SALMETEROL 115-21 MCG/ACT IN AERO: 2.0000 | INHALATION_SPRAY | Freq: Two times a day (BID) | RESPIRATORY_TRACT | Status: DC

## 2015-09-24 MED ORDER — ALPRAZOLAM 0.5 MG PO TABS: ORAL_TABLET | ORAL | Status: DC

## 2015-09-24 MED ORDER — CLARITHROMYCIN 500 MG PO TABS: ORAL_TABLET | ORAL | Status: DC

## 2015-09-24 NOTE — Progress Notes (Signed)
   Subjective:    Patient ID: Ronald Juarez, male    DOB: 11-05-1956, 59 y.o.   MRN: [id number]  Hypertension This is a chronic problem. The current episode started more than 1 year ago. There are no compliance problems.    compliant with blood pressure medicine. No obvious side effects of except perhaps swelling of ankles. Generally does not miss a dose.   Notes ongoing challenges swelling ankles. No shortness of breath no orthopnea. Tries to wear pressure stockings during the day. See prior note.   wals a lot at work, at [employer] does a lot of walking   Somewhat productive cough congestion over the past week. Please see prior notes. Treated twice already for sinusitis/bronchitis with dominant exacerbation COPD.  Ongoing troubles with sleep uses a press Lamb with some success. Without the medicine does not get enough rest.  Trying to work on diet somewhat, not the best and not the worst Patient states no concerns this visit.  Review of Systems No headache no chest pain no back pain abdominal pain no change in bowel habits    Objective:   Physical Exam  Alert vitals stable. HEENT normal. Lungs clear. Heart regular in rhythm. Ankles 1+ edema. Bronchial cough during exam      Assessment & Plan:  Impression 1 hypertension good control maintain same meds discussed #2 insomnia ongoing need for medications meds refilled. #3 depression clinically stable states Wellbutrin definitely helping #4 post sinus bronchitis persisting. #5 venous stasis edema would prefer holding off on diarrhetic has had trouble in the past anyway plan flu shot and pneumonia shot today. Diet exercise discussed. Medications refilled recheck in 6 months for wellness plus problem visit

## 2015-10-10 ENCOUNTER — Other Ambulatory Visit: Payer: Self-pay | Admitting: Family Medicine

## 2015-10-15 ENCOUNTER — Encounter: Payer: Self-pay | Admitting: Family Medicine

## 2015-10-15 ENCOUNTER — Ambulatory Visit (INDEPENDENT_AMBULATORY_CARE_PROVIDER_SITE_OTHER): Payer: Commercial Managed Care - HMO | Admitting: Family Medicine

## 2015-10-15 VITALS — BP 130/80 | Ht 72.5 in | Wt 270.0 lb

## 2015-10-15 DIAGNOSIS — R739 Hyperglycemia, unspecified: Secondary | ICD-10-CM

## 2015-10-15 DIAGNOSIS — R600 Localized edema: Secondary | ICD-10-CM | POA: Diagnosis not present

## 2015-10-15 DIAGNOSIS — R0609 Other forms of dyspnea: Secondary | ICD-10-CM

## 2015-10-15 DIAGNOSIS — R0600 Dyspnea, unspecified: Secondary | ICD-10-CM

## 2015-10-15 MED ORDER — AMLODIPINE BESY-BENAZEPRIL HCL 5-20 MG PO CAPS: 1.0000 | ORAL_CAPSULE | Freq: Every day | ORAL | Status: DC

## 2015-10-15 NOTE — Progress Notes (Signed)
   Subjective:    Patient ID: Ronald Juarez, male    DOB: Apr 16, 1956, 59 y.o.   MRN: [id number]  HPI  Patient arrives with c/o swelling and draining in legs. He relates exam significant swelling of the lower legs he denies any PND denies orthopnea does have some dyspnea when he pushes himself denies any chest pressure denies fevers chills denies hemoptysis. Patient has long history of smoking but he quit last year. Patient also on blood pressure medicines. Denies any change in diet. Denies any history of heart failure. Review of Systems    patient was swelling in the legs see above as well. Denies head congestion relate some coughing Objective:   Physical Exam Lungs are clear hearts regular abdomen soft obese no tenderness extremities 1-2+ edema lower legs bilateral. Worse on the left than the right but significant both legs       Assessment & Plan:  Pedal edema-I believe this is related to his blood pressure medicine his blood pressure today with a large cuff is fantastic 110/72 I recommend changing to a lower strength amlodipine. Also recommend follow-up 4-6 weeks to recheck blood pressure if leg swelling is not significantly better over the next 2 weeks patient to let us know Has history hyperglycemia check U8E Check metabolic and liver function because of swelling BNP to rule out congestive heart failure

## 2015-10-16 LAB — HEPATIC FUNCTION PANEL
ALBUMIN: 4.1 g/dL (ref 3.5–5.5)
ALK PHOS: 83 IU/L (ref 39–117)
ALT: 34 IU/L (ref 0–44)
AST: 23 IU/L (ref 0–40)
Bilirubin, Direct: 0.06 mg/dL (ref 0.00–0.40)
TOTAL PROTEIN: 6.7 g/dL (ref 6.0–8.5)

## 2015-10-16 LAB — BASIC METABOLIC PANEL
BUN/Creatinine Ratio: 19 (ref 9–20)
BUN: 14 mg/dL (ref 6–24)
CALCIUM: 9.6 mg/dL (ref 8.7–10.2)
CO2: 23 mmol/L (ref 18–29)
CREATININE: 0.73 mg/dL — AB (ref 0.76–1.27)
Chloride: 101 mmol/L (ref 97–106)
GFR calc Af Amer: 117 mL/min/{1.73_m2} (ref >59)
GFR, EST NON AFRICAN AMERICAN: 102 mL/min/{1.73_m2} (ref >59)
GLUCOSE: 142 mg/dL — AB (ref 65–99)
POTASSIUM: 4.3 mmol/L (ref 3.5–5.2)
SODIUM: 144 mmol/L (ref 136–144)

## 2015-10-16 LAB — HEMOGLOBIN A1C
Est. average glucose Bld gHb Est-mCnc: 131 mg/dL
Hgb A1c MFr Bld: 6.2 % — ABNORMAL HIGH (ref 4.8–5.6)

## 2015-10-16 LAB — BRAIN NATRIURETIC PEPTIDE: BNP: 26.7 pg/mL (ref 0.0–100.0)

## 2015-10-18 ENCOUNTER — Telehealth: Payer: Self-pay | Admitting: *Deleted

## 2015-10-18 DIAGNOSIS — M79661 Pain in right lower leg: Secondary | ICD-10-CM

## 2015-10-18 DIAGNOSIS — M7989 Other specified soft tissue disorders: Secondary | ICD-10-CM

## 2015-10-18 NOTE — Telephone Encounter (Signed)
Often swelling is asymmetric depending on which leg has the poorer veins but with him saying that lets do u s of swollen leg

## 2015-10-18 NOTE — Telephone Encounter (Signed)
LMRC

## 2015-10-18 NOTE — Telephone Encounter (Signed)
Stat venous right leg u/s scheduled for tomorrow at 9 am. Pt notified. Pt states he cannot go today.

## 2015-10-18 NOTE — Telephone Encounter (Signed)
Called and discussed bw results with pt. Pt was seen 10/31 by dr Nicki Reaper for bilateral leg swelling. He wanted to let you know that right leg is still swelling left leg is better. He was told to follow up in 2 weeks. Using compression stockings and at visit amlodipine-benazepril was decreased from 10 to 5-20.

## 2015-10-19 ENCOUNTER — Ambulatory Visit (HOSPITAL_COMMUNITY)
Admission: RE | Admit: 2015-10-19 | Discharge: 2015-10-19 | Disposition: A | Discharge status: Home / Self Care | Payer: Commercial Managed Care - HMO | Source: Ambulatory Visit | Attending: Family Medicine | Admitting: Family Medicine

## 2015-10-19 ENCOUNTER — Ambulatory Visit (HOSPITAL_COMMUNITY): Admission: RE | Admit: 2015-10-19 | Payer: Commercial Managed Care - HMO | Source: Ambulatory Visit

## 2015-10-19 DIAGNOSIS — M7989 Other specified soft tissue disorders: Secondary | ICD-10-CM | POA: Diagnosis not present

## 2015-10-19 DIAGNOSIS — M79604 Pain in right leg: Secondary | ICD-10-CM | POA: Insufficient documentation

## 2015-10-22 ENCOUNTER — Telehealth: Payer: Self-pay | Admitting: Family Medicine

## 2015-10-22 NOTE — Telephone Encounter (Signed)
Add lasix 20 mg two qam for three days, then one am  Plus ktab 20 meq one qam,  Ov bnext wk

## 2015-10-22 NOTE — Telephone Encounter (Signed)
Left message to return call 

## 2015-10-22 NOTE — Telephone Encounter (Signed)
Pt called stating that his right leg is still swollen and is seeping. Please advise.

## 2015-10-23 MED ORDER — FUROSEMIDE 20 MG PO TABS: ORAL_TABLET | ORAL | Status: DC

## 2015-10-23 MED ORDER — POTASSIUM CHLORIDE CRYS ER 20 MEQ PO TBCR: 20.0000 meq | EXTENDED_RELEASE_TABLET | ORAL | Status: DC

## 2015-10-23 NOTE — Telephone Encounter (Signed)
Meds sent to pharmacy. Patient transferred to front desk to schedule appointment.

## 2015-10-23 NOTE — Addendum Note (Signed)
Addended by: Jesusita Oka on: 10/23/2015 10:27 AM   Modules accepted: Orders

## 2015-10-30 ENCOUNTER — Encounter: Payer: Self-pay | Admitting: Family Medicine

## 2015-10-30 ENCOUNTER — Ambulatory Visit (INDEPENDENT_AMBULATORY_CARE_PROVIDER_SITE_OTHER): Payer: Commercial Managed Care - HMO | Admitting: Family Medicine

## 2015-10-30 VITALS — BP 130/78 | Ht 72.5 in | Wt 273.0 lb

## 2015-10-30 DIAGNOSIS — R0609 Other forms of dyspnea: Secondary | ICD-10-CM | POA: Diagnosis not present

## 2015-10-30 DIAGNOSIS — I878 Other specified disorders of veins: Secondary | ICD-10-CM | POA: Diagnosis not present

## 2015-10-30 DIAGNOSIS — I10 Essential (primary) hypertension: Secondary | ICD-10-CM

## 2015-10-30 DIAGNOSIS — L03115 Cellulitis of right lower limb: Secondary | ICD-10-CM

## 2015-10-30 DIAGNOSIS — R0600 Dyspnea, unspecified: Secondary | ICD-10-CM

## 2015-10-30 MED ORDER — CEPHALEXIN 500 MG PO CAPS: 500.0000 mg | ORAL_CAPSULE | Freq: Three times a day (TID) | ORAL | Status: DC

## 2015-10-30 NOTE — Progress Notes (Signed)
   Subjective:    Patient ID: Ronald Juarez, male    DOB: [birth year], 59 y.o.   MRN: [id number] Ongoing multiple challenges with leg.   HPI Patient arrives for a recheck on right leg swelling. Patient states it is doing some better but is still swollen. Compliant with new blood pressure medicine. Handling well. Next  Still notes some pain in the right leg. Did have an initial injury with resulting persistent ulcer. More swollen right leg. Next  On further history present swollen both legs for several years.  Positive family history of recurrent phlebitis in mother.  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      Assessment & Plan:  Impression 1 chronic venous stasis worsening discussed on 2 hypertension better control #3 COPD patient brings in a list of peritenon side effects from pain he is exposed to work #3 probable element of cellulitis for doubt DVT with negative ultrasound always possible plan antibiotics rationale discussed maintain diuretic. Repeat ultrasound way. Vascular surgeon referral due to severity of venous stasis

## 2015-10-31 ENCOUNTER — Ambulatory Visit (HOSPITAL_COMMUNITY)
Admission: RE | Admit: 2015-10-31 | Discharge: 2015-10-31 | Disposition: A | Discharge status: Home / Self Care | Payer: Commercial Managed Care - HMO | Source: Ambulatory Visit | Attending: Family Medicine | Admitting: Family Medicine

## 2015-10-31 DIAGNOSIS — M7989 Other specified soft tissue disorders: Secondary | ICD-10-CM | POA: Diagnosis present

## 2015-10-31 DIAGNOSIS — M79604 Pain in right leg: Secondary | ICD-10-CM | POA: Diagnosis not present

## 2015-11-06 ENCOUNTER — Other Ambulatory Visit: Payer: Self-pay

## 2015-11-06 DIAGNOSIS — I878 Other specified disorders of veins: Secondary | ICD-10-CM

## 2015-11-16 ENCOUNTER — Encounter: Payer: Self-pay | Admitting: Vascular Surgery

## 2015-11-19 ENCOUNTER — Encounter: Payer: Self-pay | Admitting: Family Medicine

## 2015-11-19 ENCOUNTER — Ambulatory Visit (INDEPENDENT_AMBULATORY_CARE_PROVIDER_SITE_OTHER): Payer: Commercial Managed Care - HMO | Admitting: Family Medicine

## 2015-11-19 VITALS — BP 130/76 | Ht 72.5 in | Wt 273.0 lb

## 2015-11-19 DIAGNOSIS — I878 Other specified disorders of veins: Secondary | ICD-10-CM

## 2015-11-19 MED ORDER — POTASSIUM CHLORIDE CRYS ER 20 MEQ PO TBCR: 20.0000 meq | EXTENDED_RELEASE_TABLET | ORAL | Status: DC

## 2015-11-19 MED ORDER — FUROSEMIDE 20 MG PO TABS: ORAL_TABLET | ORAL | Status: DC

## 2015-11-19 NOTE — Progress Notes (Signed)
   Subjective:    Patient ID: Ronald Juarez, male    DOB: 1956-11-06, 59 y.o.   MRN: [id number]  HPI Patient in today for a follow up visit for venous stasis. Patient has completed course of ABT.  Due to see vasc surg this wk  Leg still swollen but better, had a leg injury still having more swollen on right than left. Small injury. No fever no redness no chills. Take All of antibiotics.  Patient states no other concerns this visit.+  Review of Systems No headache no chest pain no particular leg pain    Objective:   Physical Exam  Alert vitals stable lungs clear heart rare rhythm right leg less edema positive chronic venous stasis changes small shallow skin injury no erythema no tenderness      Assessment & Plan:  Impression venous stasis discussed once again plan add prescription stockings. Rationale discussed. No further antibiotics. Recheck in several months WSL

## 2015-11-21 ENCOUNTER — Encounter: Payer: Self-pay | Admitting: Vascular Surgery

## 2015-11-21 ENCOUNTER — Ambulatory Visit (INDEPENDENT_AMBULATORY_CARE_PROVIDER_SITE_OTHER): Payer: Commercial Managed Care - HMO | Admitting: Vascular Surgery

## 2015-11-21 ENCOUNTER — Ambulatory Visit (HOSPITAL_COMMUNITY)
Admission: RE | Admit: 2015-11-21 | Discharge: 2015-11-21 | Disposition: A | Discharge status: Home / Self Care | Payer: Commercial Managed Care - HMO | Source: Ambulatory Visit | Attending: Vascular Surgery | Admitting: Vascular Surgery

## 2015-11-21 VITALS — BP 136/77 | HR 70 | Temp 98.5°F | Resp 16 | Ht 72.0 in | Wt 265.0 lb

## 2015-11-21 DIAGNOSIS — I878 Other specified disorders of veins: Secondary | ICD-10-CM | POA: Diagnosis not present

## 2015-11-21 DIAGNOSIS — I872 Venous insufficiency (chronic) (peripheral): Secondary | ICD-10-CM

## 2015-11-21 DIAGNOSIS — M7989 Other specified soft tissue disorders: Secondary | ICD-10-CM | POA: Diagnosis not present

## 2015-11-21 NOTE — Progress Notes (Signed)
[facility] [place]  Patient name: Ronald Juarez MRN: [id number] DOB: May 22, 1956 Sex: male  REASON FOR CONSULT: Right leg swelling. Referred by Dr. Wolfgang Phoenix  HPI: Ronald Juarez is a 59 y.o. male, who is who has had chronic bilateral lower extremity swelling. This is worse on the right side. 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 He has had chronic bilateral lower extremity swelling. Approximately 2 weeks ago he developed a small wound on his right pretibial area after he bumped his leg. He states that the wound drained clear fluid. He is sent for vascular consultation. He denies any history of claudication, rest pain, or nonhealing ulcers.  I have reviewed the records that were sent from Dr. Lance Sell office. The patient has had issues with right leg swelling. He did have a venous duplex scan on 10/31/2015 which showed no evidence of right lower extremity DVT.  Past Medical History  Diagnosis Date  . Anxiety   . Insomnia   . Stress   . Hypertension   . ED (erectile dysfunction)   . Allergic rhinitis   . Reactive airways dysfunction syndrome   . PNA (pneumonia)     Family History  Problem Relation Age of Onset  . Heart disease Mother   . Cancer Father     SOCIAL HISTORY: Social History   Social History  . Marital Status: Married    Spouse Name: N/A  . Number of Children: N/A  . Years of Education: N/A   Occupational History  . Not on file.   Social History Main Topics  . Smoking status: Former Smoker -- 1.00 packs/day    Types: Cigarettes  . Smokeless tobacco: Not on file  . Alcohol Use: 0.0 oz/week    0 Standard drinks or equivalent per week     Comment: occ. use  . Drug Use: No  . Sexual Activity: Not on file   Other Topics Concern  . Not on file   Social History Narrative    Allergies  Allergen Reactions  . Bee Venom   . Hctz [Hydrochlorothiazide]    Dizziness and sycope  . Lozol [Indapamide]     Dizziness and syncope    Current Outpatient Prescriptions  Medication Sig Dispense Refill  . albuterol (PROAIR HFA) 108 (90 BASE) MCG/ACT inhaler USE 2 PUFFS EVERY 6 HOURS AS NEEDED FOR WHEEZING. 8.5 g 5  . ALPRAZolam (XANAX) 0.5 MG tablet TAKE 1/2-1 TAB BY MOUTH AT BEDTIME AS NEEDED 30 tablet 5  . amLODipine-benazepril (LOTREL) 5-20 MG capsule Take 1 capsule by mouth daily. 30 capsule 5  . aspirin 81 MG tablet Take 81 mg by mouth daily.    Marland Kitchen buPROPion (WELLBUTRIN SR) 150 MG 12 hr tablet Take 1 tablet (150 mg total) by mouth 2 (two) times daily. 60 tablet 5  . EPINEPHrine (EPI-PEN) 0.3 mg/0.3 mL SOAJ Inject 0.3 mLs (0.3 mg total) into the muscle once. 2 Device 1  . fluticasone-salmeterol (ADVAIR HFA) 115-21 MCG/ACT inhaler Inhale 2 puffs into the lungs 2 (two) times daily. 1 Inhaler 3  . furosemide (LASIX) 20 MG tablet Take 2 tabs qam x 3 days then 1 tab qam 30 tablet 0  . potassium chloride SA (K-DUR,KLOR-CON) 20 MEQ tablet Take 1 tablet (20 mEq total) by mouth every morning. 30 tablet 0  . VIAGRA 100 MG tablet TAKE ONE TABLET ONCE A DAY IF NEEDED  10 tablet 5   No current facility-administered medications for this visit.    REVIEW OF SYSTEMS:  [X]  denotes positive finding, [ ]  denotes negative finding Cardiac  Comments:  Chest pain or chest pressure:    Shortness of breath upon exertion: X   Short of breath when lying flat:    Irregular heart rhythm:        Vascular    Pain in calf, thigh, or hip brought on by ambulation:    Pain in feet at night that wakes you up from your sleep:     Blood clot in your veins:    Leg swelling:  X       Pulmonary    Oxygen at home:    Productive cough:     Wheezing:  X       Neurologic    Sudden weakness in arms or legs:     Sudden numbness in arms or legs:     Sudden onset of difficulty speaking or slurred speech:    Temporary loss of vision in one eye:     Problems with dizziness:           Gastrointestinal    Blood in stool:     Vomited blood:         Genitourinary    Burning when urinating:     Blood in urine:        Psychiatric    Major depression:         Hematologic    Bleeding problems:    Problems with blood clotting too easily:        Skin    Rashes or ulcers:        Constitutional    Fever or chills:      PHYSICAL EXAM: Filed Vitals:   11/21/15 1316  BP: 136/77  Pulse: 70  Temp: 98.5 F (36.9 C)  TempSrc: Oral  Resp: 16  Height: 6' (1.829 m)  Weight: 265 lb (120.203 kg)  SpO2: 95%    GENERAL: The patient is a well-nourished male, in no acute distress. The vital signs are documented above. CARDIAC: There is a regular rate and rhythm.  VASCULAR: I do not detect carotid bruits. He has palpable femoral pulses. I cannot palpate pedal pulses although both feet are warm and well-perfused. He has bilateral moderate lower extremity swelling which is worse on the right side. PULMONARY: There is good air exchange bilaterally without wheezing or rales. ABDOMEN: Soft and non-tender with normal pitched bowel sounds.  MUSCULOSKELETAL: There are no major deformities or cyanosis. NEUROLOGIC: No focal weakness or paresthesias are detected. SKIN: There are no ulcers or rashes noted. He has hyperpigmentation bilaterally consistent with chronic venous insufficiency. PSYCHIATRIC: The patient has a normal affect.  DATA:   LOWER EXTREMITY VENOUS DUPLEX: I have independently reviewed and interpreted his right lower extremity venous duplex can. There is no evidence of DVT in the right lower extremity. There is no reflux in the deep system or the superficial system.  MEDICAL ISSUES:  CHRONIC VENOUS INSUFFICIENCY: I would agree with Dr. Wolfgang Phoenix that he has significant chronic venous insufficiency based on his exam. He has swelling and hyperpigmentation. He may have a component of lymphedema in the right leg also although the treatment would be the same. We did not see  evidence of reflux in the saphenous vein on the right so he would not be a candidate for laser ablation. We have discussed the importance of intermittent leg elevation  in the proper positioning for this. I have written him a prescription for compression stockings with a gradient of 20-30 mmHg. I have encouraged him to avoid prolonged sitting and standing. I have encouraged him to exercise and consider water aerobics. I have also instructed him on keeping his skin well lubricated during the winter. I'll be happy to see him back in a time if any new vascular issues arise.   Deitra Mayo [facility] [place]: [phone]

## 2015-12-15 ENCOUNTER — Other Ambulatory Visit: Payer: Self-pay | Admitting: Family Medicine

## 2015-12-17 ENCOUNTER — Other Ambulatory Visit: Payer: Self-pay | Admitting: Family Medicine

## 2016-03-14 ENCOUNTER — Ambulatory Visit (INDEPENDENT_AMBULATORY_CARE_PROVIDER_SITE_OTHER): Payer: Commercial Managed Care - HMO | Admitting: Family Medicine

## 2016-03-14 ENCOUNTER — Encounter: Payer: Self-pay | Admitting: Family Medicine

## 2016-03-14 VITALS — BP 140/84 | Ht 72.0 in | Wt 257.0 lb

## 2016-03-14 DIAGNOSIS — E785 Hyperlipidemia, unspecified: Secondary | ICD-10-CM

## 2016-03-14 DIAGNOSIS — Z125 Encounter for screening for malignant neoplasm of prostate: Secondary | ICD-10-CM | POA: Diagnosis not present

## 2016-03-14 DIAGNOSIS — Z79899 Other long term (current) drug therapy: Secondary | ICD-10-CM

## 2016-03-14 DIAGNOSIS — I10 Essential (primary) hypertension: Secondary | ICD-10-CM | POA: Diagnosis not present

## 2016-03-14 DIAGNOSIS — J4520 Mild intermittent asthma, uncomplicated: Secondary | ICD-10-CM | POA: Diagnosis not present

## 2016-03-14 DIAGNOSIS — J683 Other acute and subacute respiratory conditions due to chemicals, gases, fumes and vapors: Secondary | ICD-10-CM

## 2016-03-14 DIAGNOSIS — Z0000 Encounter for general adult medical examination without abnormal findings: Secondary | ICD-10-CM

## 2016-03-14 MED ORDER — ALBUTEROL SULFATE HFA 108 (90 BASE) MCG/ACT IN AERS: INHALATION_SPRAY | RESPIRATORY_TRACT | Status: DC

## 2016-03-14 MED ORDER — BUPROPION HCL ER (SR) 150 MG PO TB12: 150.0000 mg | ORAL_TABLET | Freq: Two times a day (BID) | ORAL | Status: DC

## 2016-03-14 MED ORDER — AMLODIPINE BESY-BENAZEPRIL HCL 5-20 MG PO CAPS: 1.0000 | ORAL_CAPSULE | Freq: Every day | ORAL | Status: DC

## 2016-03-14 MED ORDER — FUROSEMIDE 20 MG PO TABS: ORAL_TABLET | ORAL | Status: DC

## 2016-03-14 MED ORDER — FLUTICASONE-SALMETEROL 115-21 MCG/ACT IN AERO
2.0000 | INHALATION_SPRAY | Freq: Two times a day (BID) | RESPIRATORY_TRACT | Status: DC
Start: 2016-03-14 — End: 2016-11-05

## 2016-03-14 MED ORDER — ALPRAZOLAM 0.5 MG PO TABS: ORAL_TABLET | ORAL | Status: DC

## 2016-03-14 MED ORDER — POTASSIUM CHLORIDE CRYS ER 20 MEQ PO TBCR: 20.0000 meq | EXTENDED_RELEASE_TABLET | Freq: Every morning | ORAL | Status: DC

## 2016-03-14 MED ORDER — SILDENAFIL CITRATE 100 MG PO TABS: ORAL_TABLET | ORAL | Status: DC

## 2016-03-14 NOTE — Progress Notes (Signed)
   Subjective:    Patient ID: Ronald Juarez, male    DOB: [birth year], 60 y.o.   MRN: [id number]  HPI The patient comes in today for a wellness visit.    A review of their health history was completed.  A review of medications was also completed.  Any needed refills; None  Eating habits: Patient states eating habits are good. Has changed diet to a 6 week body makeover diet.   Falls/  MVA accidents in past few months: None  Regular exercise: Patient walks daily.  Specialist pt sees on regular basis: None  Preventative health issues were discussed.   Additional concerns: Patient states no other concerns this visit. Pt walks a lot, three mi per d at work,  Overdue on colonoscopy, done in 08. Told to do again in five yrs    Review of Systems  Constitutional: Negative for fever, activity change and appetite change.  HENT: Negative for congestion and rhinorrhea.   Eyes: Negative for discharge.  Respiratory: Negative for cough and wheezing.   Cardiovascular: Negative for chest pain.  Gastrointestinal: Negative for vomiting, abdominal pain and blood in stool.  Genitourinary: Negative for frequency and difficulty urinating.  Musculoskeletal: Negative for neck pain.  Skin: Negative for rash.  Allergic/Immunologic: Negative for environmental allergies and food allergies.  Neurological: Negative for weakness and headaches.  Psychiatric/Behavioral: Negative for agitation.  All other systems reviewed and are negative.      Objective:   Physical Exam  Constitutional: He appears well-developed and well-nourished.  HENT:  Head: Normocephalic and atraumatic.  Right Ear: External ear normal.  Left Ear: External ear normal.  Nose: Nose normal.  Mouth/Throat: Oropharynx is clear and moist.  Eyes: EOM are normal. Pupils are equal, round, and reactive to light.  Neck: Normal range of motion. Neck supple. No thyromegaly present.  Cardiovascular: Normal rate, regular rhythm and normal  heart sounds.   No murmur heard. Pulmonary/Chest: Effort normal and breath sounds normal. No respiratory distress. He has no wheezes.  Abdominal: Soft. Bowel sounds are normal. He exhibits no distension and no mass. There is no tenderness.  Genitourinary: Prostate normal and penis normal.  Musculoskeletal: Normal range of motion. He exhibits no edema.  Lymphadenopathy:    He has no cervical adenopathy.  Neurological: He is alert. He exhibits normal muscle tone.  Skin: Skin is warm and dry. No erythema.  Psychiatric: He has a normal mood and affect. His behavior is normal. Judgment normal.  Vitals reviewed.         Assessment & Plan:  Impression 1 wellness exam #2 hypertension good control meds reviewed to maintain same #3 insomnia good control with Xanax meds reviewed to maintain same #4 impaired fasting glucose No. 5 chronic lung disease with reactive airway component plan diet exercise discussed. Patient to call and schedule colonoscopy. Appropriate blood work. Further recommendations based results. Medications refilled. Diet exercise discussed WSL

## 2016-03-15 LAB — BASIC METABOLIC PANEL
BUN/Creatinine Ratio: 20 (ref 9–20)
BUN: 15 mg/dL (ref 6–24)
CO2: 23 mmol/L (ref 18–29)
CREATININE: 0.74 mg/dL — AB (ref 0.76–1.27)
Calcium: 9.1 mg/dL (ref 8.7–10.2)
Chloride: 101 mmol/L (ref 96–106)
GFR calc Af Amer: 117 mL/min/{1.73_m2} (ref >59)
GFR calc non Af Amer: 101 mL/min/{1.73_m2} (ref >59)
GLUCOSE: 94 mg/dL (ref 65–99)
Potassium: 4.8 mmol/L (ref 3.5–5.2)
Sodium: 140 mmol/L (ref 134–144)

## 2016-03-15 LAB — HEPATIC FUNCTION PANEL
ALBUMIN: 4.3 g/dL (ref 3.5–5.5)
ALT: 35 IU/L (ref 0–44)
AST: 32 IU/L (ref 0–40)
Alkaline Phosphatase: 74 IU/L (ref 39–117)
Bilirubin Total: 0.6 mg/dL (ref 0.0–1.2)
Bilirubin, Direct: 0.19 mg/dL (ref 0.00–0.40)
TOTAL PROTEIN: 7.1 g/dL (ref 6.0–8.5)

## 2016-03-15 LAB — LIPID PANEL
CHOL/HDL RATIO: 3.5 ratio (ref 0.0–5.0)
Cholesterol, Total: 187 mg/dL (ref 100–199)
HDL: 53 mg/dL (ref >39)
LDL Calculated: 114 mg/dL — ABNORMAL HIGH (ref 0–99)
Triglycerides: 101 mg/dL (ref 0–149)
VLDL CHOLESTEROL CAL: 20 mg/dL (ref 5–40)

## 2016-03-15 LAB — PSA: PROSTATE SPECIFIC AG, SERUM: 0.8 ng/mL (ref 0.0–4.0)

## 2016-03-16 ENCOUNTER — Encounter: Payer: Self-pay | Admitting: Family Medicine

## 2016-09-12 ENCOUNTER — Encounter: Payer: Self-pay | Admitting: Family Medicine

## 2016-09-12 ENCOUNTER — Ambulatory Visit (INDEPENDENT_AMBULATORY_CARE_PROVIDER_SITE_OTHER): Payer: Commercial Managed Care - HMO | Admitting: Family Medicine

## 2016-09-12 VITALS — BP 134/80 | Ht 72.0 in | Wt 270.0 lb

## 2016-09-12 DIAGNOSIS — J4520 Mild intermittent asthma, uncomplicated: Secondary | ICD-10-CM

## 2016-09-12 DIAGNOSIS — G5602 Carpal tunnel syndrome, left upper limb: Secondary | ICD-10-CM | POA: Diagnosis not present

## 2016-09-12 DIAGNOSIS — I10 Essential (primary) hypertension: Secondary | ICD-10-CM

## 2016-09-12 DIAGNOSIS — J683 Other acute and subacute respiratory conditions due to chemicals, gases, fumes and vapors: Secondary | ICD-10-CM

## 2016-09-12 DIAGNOSIS — F411 Generalized anxiety disorder: Secondary | ICD-10-CM

## 2016-09-12 MED ORDER — EPINEPHRINE 0.3 MG/0.3ML IJ SOAJ: 0.3000 mg | Freq: Once | INTRAMUSCULAR | 1 refills | Status: DC

## 2016-09-12 MED ORDER — EPINEPHRINE 0.3 MG/0.3ML IJ SOAJ: 0.3000 mg | Freq: Once | INTRAMUSCULAR | 0 refills | Status: AC

## 2016-09-12 NOTE — Progress Notes (Signed)
  [facility] TMs it's well Subjective:    Patient ID: Ronald Juarez, male    DOB: [birth year], 60 y.o.   MRN: [id number]  Hypertension  This is a chronic problem. Treatments tried: amlodipine-benazepril.   Blood pressure medicine and blood pressure levels reviewed today with patient. Compliant with blood pressure medicine. States does not miss a dose. No obvious side effects. Blood pressure generally good when checked elsewhere. Watching salt intake. BP usually around 130 syst   And dias usual 70 or so  Dinks a lot of water   Pt notes numbness in his left hand, pain too, wakes up hurting and caching ,working overtime, mostly left  Asthma stable sticking with meds, take occa s  Rare useOf inhaler at this time.  Ongoing challenges with anxiety and insomnia. States deftly needs medications and work to benefit. Also notes Wellbutrin helps mood indefinitely sticks with.   Pt has concerns about carpel tunnel. Daytime and nighttime symptoms. Generally left hand. Been doing a lot of overtime lately. Wakes up at night but challenges with notes tingling in distribution of median nerve  Pt already had flu vaccine.   Needs refill on epi pen.    Review of Systems No headache, no major weight loss or weight gain, no chest pain no back pain abdominal pain no change in bowel habits complete ROS otherwise negative     Objective:   Physical Exam Alert vitals stable, NAD. Blood pressure good on repeat. HEENT normal. Lungs clear. Heart regular rate and rhythm. Positive Phalen's sign positive Tinel sign left ear       Assessment & Plan:  Impression hypertension good control discussed maintain same meds #2 asthma ongoing good control discussed maintain same meds #3 depression anxiety and insomnia all stable on current meds to maintain same discussed #4 carpal tunnel syndrome. Needs diagnosis. Wear nighttime splint rationale discussed plan meds refilled. As noted above follow-up in 6 months for  wellness plus chronic visit then [facility]

## 2016-09-28 ENCOUNTER — Other Ambulatory Visit: Payer: Self-pay | Admitting: Family Medicine

## 2016-09-29 NOTE — Telephone Encounter (Signed)
Six mo worth if time for ref

## 2016-09-30 ENCOUNTER — Other Ambulatory Visit: Payer: Self-pay | Admitting: Family Medicine

## 2016-10-01 NOTE — Telephone Encounter (Signed)
Dr Steves 

## 2016-10-02 ENCOUNTER — Other Ambulatory Visit: Payer: Self-pay | Admitting: Family Medicine

## 2016-11-04 ENCOUNTER — Other Ambulatory Visit: Payer: Self-pay | Admitting: *Deleted

## 2016-11-04 MED ORDER — DOXYCYCLINE HYCLATE 100 MG PO TABS: 100.0000 mg | ORAL_TABLET | Freq: Two times a day (BID) | ORAL | 0 refills | Status: DC

## 2016-11-05 ENCOUNTER — Encounter: Payer: Self-pay | Admitting: Family Medicine

## 2016-11-05 ENCOUNTER — Ambulatory Visit (INDEPENDENT_AMBULATORY_CARE_PROVIDER_SITE_OTHER): Payer: Commercial Managed Care - HMO | Admitting: Family Medicine

## 2016-11-05 VITALS — BP 128/84 | Ht 72.0 in | Wt 274.0 lb

## 2016-11-05 DIAGNOSIS — I878 Other specified disorders of veins: Secondary | ICD-10-CM | POA: Diagnosis not present

## 2016-11-05 DIAGNOSIS — L03115 Cellulitis of right lower limb: Secondary | ICD-10-CM

## 2016-11-05 MED ORDER — ALBUTEROL SULFATE HFA 108 (90 BASE) MCG/ACT IN AERS: INHALATION_SPRAY | RESPIRATORY_TRACT | 5 refills | Status: DC

## 2016-11-05 MED ORDER — FLUTICASONE-SALMETEROL 115-21 MCG/ACT IN AERO: 2.0000 | INHALATION_SPRAY | Freq: Two times a day (BID) | RESPIRATORY_TRACT | 5 refills | Status: DC

## 2016-11-05 NOTE — Progress Notes (Signed)
   Subjective:    Patient ID: Ronald Juarez, male    DOB: 03-02-1956, 60 y.o.   MRN: [id number]  HPI Patient with c/o of venous stasis ulcers on right leg. Patient was started on doxycycline yesterday by dr Richardson Landry. This patient has had problems of venous stasis he is seen in pain specialist. They did not recommend any surgery. He does use support hose on a regular basis. Does try to keep his feet propped up he states a few days ago he noticed burning discomfort in the right lower leg along with what he thought was dry skin but then when he looked at it was tender and red no drainage or pus no fever  Review of Systems  Constitutional: Negative for fatigue and fever.  HENT: Positive for congestion. Negative for rhinorrhea.   Respiratory: Positive for cough. Negative for shortness of breath.        Objective:   Physical Exam  Constitutional: He appears well-nourished.  Cardiovascular: Normal rate, regular rhythm and normal heart sounds.   No murmur heard. Pulmonary/Chest: Effort normal and breath sounds normal.  Musculoskeletal: He exhibits edema.  Lymphadenopathy:    He has no cervical adenopathy.  Neurological: He is alert.  Psychiatric: His behavior is normal.  Vitals reviewed.         Assessment & Plan:  Patient does have history of COPD using his medications refills given as requested  Patient with venous stasis on the right lower leg along with some cellulitis antibiotics prescribed. Patient will do warm compresses keep elevated he will follow-up if progressive troubles he was educated regarding the possibility of a venous ulcer and the need to follow-up immediately should that occur.

## 2016-12-04 ENCOUNTER — Other Ambulatory Visit: Payer: Self-pay | Admitting: Family Medicine

## 2016-12-05 ENCOUNTER — Telehealth: Payer: Self-pay | Admitting: Family Medicine

## 2016-12-05 MED ORDER — BUPROPION HCL ER (SR) 150 MG PO TB12: 150.0000 mg | ORAL_TABLET | Freq: Two times a day (BID) | ORAL | 5 refills | Status: DC

## 2016-12-05 NOTE — Telephone Encounter (Signed)
Prescription sent electronically to pharmacy. Patient notified. 

## 2016-12-05 NOTE — Telephone Encounter (Signed)
Requesting Rx for bupropion HCl 150 mg  [facility]

## 2017-01-20 ENCOUNTER — Telehealth: Payer: Self-pay | Admitting: Family Medicine

## 2017-01-20 NOTE — Telephone Encounter (Signed)
ov

## 2017-01-20 NOTE — Telephone Encounter (Signed)
Patient called in with c/o of ulcers below the surface of the skin. Patient states that he has venous stasis and usually gets an antibiotic called when he notices ulcers are about to appear. Please advise?

## 2017-01-20 NOTE — Telephone Encounter (Signed)
Spoke with patient and informed him per Dr.Steve Luking- Needs office visit. Patient verbalized understanding and was transferred to the front desk to schedule an office visit.

## 2017-01-21 ENCOUNTER — Ambulatory Visit (INDEPENDENT_AMBULATORY_CARE_PROVIDER_SITE_OTHER): Payer: Commercial Managed Care - HMO | Admitting: Family Medicine

## 2017-01-21 ENCOUNTER — Encounter: Payer: Self-pay | Admitting: Family Medicine

## 2017-01-21 VITALS — BP 132/82 | Ht 72.0 in | Wt 277.6 lb

## 2017-01-21 DIAGNOSIS — L03115 Cellulitis of right lower limb: Secondary | ICD-10-CM | POA: Diagnosis not present

## 2017-01-21 DIAGNOSIS — I878 Other specified disorders of veins: Secondary | ICD-10-CM | POA: Diagnosis not present

## 2017-01-21 MED ORDER — TRIAMCINOLONE ACETONIDE 0.1 % EX CREA: 1.0000 "application " | TOPICAL_CREAM | Freq: Two times a day (BID) | CUTANEOUS | 2 refills | Status: DC

## 2017-01-21 MED ORDER — DOXYCYCLINE HYCLATE 100 MG PO TABS: 100.0000 mg | ORAL_TABLET | Freq: Two times a day (BID) | ORAL | 0 refills | Status: DC

## 2017-01-21 NOTE — Progress Notes (Signed)
   Subjective:    Patient ID: Ronald Juarez, male    DOB: Apr 15, 1956, 61 y.o.   MRN: [id number]  HPI Patient arrives with c/o 3 small ulcers under the skin on his right leg. Patient states he has a history of this .  Leg started flaring up lately  Several red bumps cropped up. No obvious tenderness. No current discharge. Patient understandably anxious because of history of cellulitis involved leg.   Some itching and pain overall is minimal      Review of Systems No cough no headache no fever no chills    Objective:   Physical Exam Alert vital stable lungs clear heart regular rhythm both legs positive changes of venous stasis evident right anterior lower shin several small erythematous papules not particularly tender no current effusion or blister or discharge       Assessment & Plan:  CTS hx of it, Flaring up more patient thinks he may need to go to hand specialist. Venous stasis with secondary either venous stasis dermatitis or this plus an element of cutaneous infection discussed plan antibiotics prescribed. Times on cream twice a day to affected area. Use pressure stockings faithfully

## 2017-02-23 ENCOUNTER — Other Ambulatory Visit: Payer: Self-pay | Admitting: Family Medicine

## 2017-03-17 ENCOUNTER — Ambulatory Visit (INDEPENDENT_AMBULATORY_CARE_PROVIDER_SITE_OTHER): Payer: Commercial Managed Care - HMO | Admitting: Family Medicine

## 2017-03-17 ENCOUNTER — Encounter: Payer: Self-pay | Admitting: Family Medicine

## 2017-03-17 VITALS — BP 122/82 | Ht 72.0 in | Wt 271.2 lb

## 2017-03-17 DIAGNOSIS — G5602 Carpal tunnel syndrome, left upper limb: Secondary | ICD-10-CM | POA: Diagnosis not present

## 2017-03-17 DIAGNOSIS — I878 Other specified disorders of veins: Secondary | ICD-10-CM

## 2017-03-17 DIAGNOSIS — Z1211 Encounter for screening for malignant neoplasm of colon: Secondary | ICD-10-CM

## 2017-03-17 DIAGNOSIS — Z125 Encounter for screening for malignant neoplasm of prostate: Secondary | ICD-10-CM

## 2017-03-17 DIAGNOSIS — F411 Generalized anxiety disorder: Secondary | ICD-10-CM | POA: Diagnosis not present

## 2017-03-17 DIAGNOSIS — I10 Essential (primary) hypertension: Secondary | ICD-10-CM

## 2017-03-17 DIAGNOSIS — Z0000 Encounter for general adult medical examination without abnormal findings: Secondary | ICD-10-CM

## 2017-03-17 DIAGNOSIS — Z79899 Other long term (current) drug therapy: Secondary | ICD-10-CM | POA: Diagnosis not present

## 2017-03-17 DIAGNOSIS — Z13220 Encounter for screening for lipoid disorders: Secondary | ICD-10-CM

## 2017-03-17 MED ORDER — FUROSEMIDE 20 MG PO TABS: ORAL_TABLET | ORAL | 5 refills | Status: DC

## 2017-03-17 MED ORDER — ALPRAZOLAM 0.5 MG PO TABS: 0.2500 mg | ORAL_TABLET | Freq: Every evening | ORAL | 5 refills | Status: DC | PRN

## 2017-03-17 MED ORDER — BUPROPION HCL ER (SR) 150 MG PO TB12: 150.0000 mg | ORAL_TABLET | Freq: Two times a day (BID) | ORAL | 5 refills | Status: DC

## 2017-03-17 MED ORDER — AMLODIPINE BESY-BENAZEPRIL HCL 5-20 MG PO CAPS: 1.0000 | ORAL_CAPSULE | Freq: Every day | ORAL | 5 refills | Status: DC

## 2017-03-17 MED ORDER — ALBUTEROL SULFATE HFA 108 (90 BASE) MCG/ACT IN AERS: INHALATION_SPRAY | RESPIRATORY_TRACT | 5 refills | Status: DC

## 2017-03-17 MED ORDER — POTASSIUM CHLORIDE CRYS ER 20 MEQ PO TBCR: 20.0000 meq | EXTENDED_RELEASE_TABLET | Freq: Every morning | ORAL | 5 refills | Status: DC

## 2017-03-17 MED ORDER — FLUTICASONE-SALMETEROL 115-21 MCG/ACT IN AERO: 2.0000 | INHALATION_SPRAY | Freq: Two times a day (BID) | RESPIRATORY_TRACT | 5 refills | Status: DC

## 2017-03-17 NOTE — Patient Instructions (Signed)
Consider wearing a nightly short wrist velcro splint on your left wrist while waiting to see the specialist

## 2017-03-17 NOTE — Progress Notes (Signed)
Subjective:    Patient ID: Ronald Juarez, male    DOB: 06/04/1956, 61 y.o.   MRN: [id number] Patient arrives office for a wellness exam/visit plus numerous chronic problems. Plus substantial new problem HPI  The patient comes in today for a wellness visit.    A review of their health history was completed.  A review of medications was also completed.  Any needed refills; yes  Eating habits: tries to eat good  Falls/  MVA accidents in past few months: none  Regular exercise: walking 3.5-5 miles a day at work, reg activity at work, doing lots of walking   Specialist pt sees on regular basis: no  Preventative health issues were discussed.   Blood pressure medicine and blood pressure levels reviewed today with patient. Compliant with blood pressure medicine. States does not miss a dose. No obvious side effects. Blood pressure generally good when checked elsewhere. Watching salt intake.  Asthma overall stable with meds. Notes shortness of breath with heavy exertion. Quit smoking 3 years ago. Occasional wheezing. Uses inhaler intermittently.  Patient notes ongoing compliance with antidepressant medication. No obvious side effects. Reports does not miss a dose. Overall continues to help depression substantially. No thoughts of homicide or suicide. Would like to maintain medication.  Left hand , notes challenges with cts, wakes up at night, shakes hand,, notes numbness in fore finger and mid finger . Noting progressive weakness and left hand. Numbness persists throughout the day.    Additional concerns: carpel tunnel in hand   Review of Systems  Constitutional: Negative for activity change, appetite change and fever.  HENT: Negative for congestion and rhinorrhea.   Eyes: Negative for discharge.  Respiratory: Negative for cough and wheezing.   Cardiovascular: Negative for chest pain.  Gastrointestinal: Negative for abdominal pain, blood in stool and vomiting.  Genitourinary:  Negative for difficulty urinating and frequency.  Musculoskeletal: Negative for neck pain.  Skin: Negative for rash.  Allergic/Immunologic: Negative for environmental allergies and food allergies.  Neurological: Negative for weakness and headaches.  Psychiatric/Behavioral: Negative for agitation.  All other systems reviewed and are negative.      Objective:   Physical Exam  Constitutional: He appears well-developed and well-nourished.  Obesity present  HENT:  Head: Normocephalic and atraumatic.  Right Ear: External ear normal.  Left Ear: External ear normal.  Nose: Nose normal.  Mouth/Throat: Oropharynx is clear and moist.  Eyes: EOM are normal. Pupils are equal, round, and reactive to light.  Neck: Normal range of motion. Neck supple. No thyromegaly present.  Cardiovascular: Normal rate, regular rhythm and normal heart sounds.   No murmur heard. Pulmonary/Chest: Effort normal and breath sounds normal. No respiratory distress. He has no wheezes.  Abdominal: Soft. Bowel sounds are normal. He exhibits no distension and no mass. There is no tenderness.  Genitourinary: Penis normal.  Genitourinary Comments: Prostate gland within normal limits  Musculoskeletal: Normal range of motion. He exhibits no edema.  Lymphadenopathy:    He has no cervical adenopathy.  Neurological: He is alert. He exhibits normal muscle tone.  Positive Phalen's sign. Positive Tinel's sign. Left hand. Sensation diminished distribution of  Skin: Skin is warm and dry. No erythema.  Substantial venous stasis changes evident  Psychiatric: He has a normal mood and affect. His behavior is normal. Judgment normal.  Vitals reviewed.         Assessment & Plan:  Impression #1 wellness exam. 2 colonoscopy. Discussed. Diet and exercise and weight loss all discussed. Already  has had zoster #2 hypertension good control discussed maintain same meds #3 asthma/COPD. Ongoing. Importance of chronic medications use discussed  #4 generalized anxiety disorder ongoing need for meds meds refilled #5 carpal tunnel syndrome left hand with evidence of progressive neuropathy. Discussed. Needs referral. Plan all medications refilled. Appropriate blood work. Diet exercise discussed. Referral for colonoscopy. Review referral for carpal tunnel syndrome left hand

## 2017-03-18 ENCOUNTER — Encounter: Payer: Self-pay | Admitting: Family Medicine

## 2017-03-18 LAB — BASIC METABOLIC PANEL
BUN/Creatinine Ratio: 13 (ref 10–24)
BUN: 12 mg/dL (ref 8–27)
CO2: 25 mmol/L (ref 18–29)
CREATININE: 0.93 mg/dL (ref 0.76–1.27)
Calcium: 9 mg/dL (ref 8.6–10.2)
Chloride: 97 mmol/L (ref 96–106)
GFR calc Af Amer: 103 mL/min/{1.73_m2} (ref >59)
GFR, EST NON AFRICAN AMERICAN: 89 mL/min/{1.73_m2} (ref >59)
GLUCOSE: 88 mg/dL (ref 65–99)
Potassium: 4.6 mmol/L (ref 3.5–5.2)
Sodium: 138 mmol/L (ref 134–144)

## 2017-03-18 LAB — HEPATIC FUNCTION PANEL
ALBUMIN: 4.2 g/dL (ref 3.6–4.8)
ALK PHOS: 78 IU/L (ref 39–117)
ALT: 40 IU/L (ref 0–44)
AST: 34 IU/L (ref 0–40)
BILIRUBIN, DIRECT: 0.19 mg/dL (ref 0.00–0.40)
Bilirubin Total: 0.6 mg/dL (ref 0.0–1.2)
TOTAL PROTEIN: 6.9 g/dL (ref 6.0–8.5)

## 2017-03-18 LAB — LIPID PANEL
CHOL/HDL RATIO: 3.4 ratio (ref 0.0–5.0)
Cholesterol, Total: 168 mg/dL (ref 100–199)
HDL: 50 mg/dL (ref >39)
LDL Calculated: 88 mg/dL (ref 0–99)
Triglycerides: 152 mg/dL — ABNORMAL HIGH (ref 0–149)
VLDL CHOLESTEROL CAL: 30 mg/dL (ref 5–40)

## 2017-03-18 LAB — PSA: PROSTATE SPECIFIC AG, SERUM: 0.8 ng/mL (ref 0.0–4.0)

## 2017-03-23 IMAGING — US US EXTREM LOW VENOUS*R*
1 series · 13 of 24 positions shown · non-contrast
Comparison: None.

CLINICAL DATA: 59-year-old male with a history of right lower
extremity pain and swelling



[Series 1: us extrem low venous*right* · 0.10mm/px · 13 of 37 slices shown]
[im 1/37]
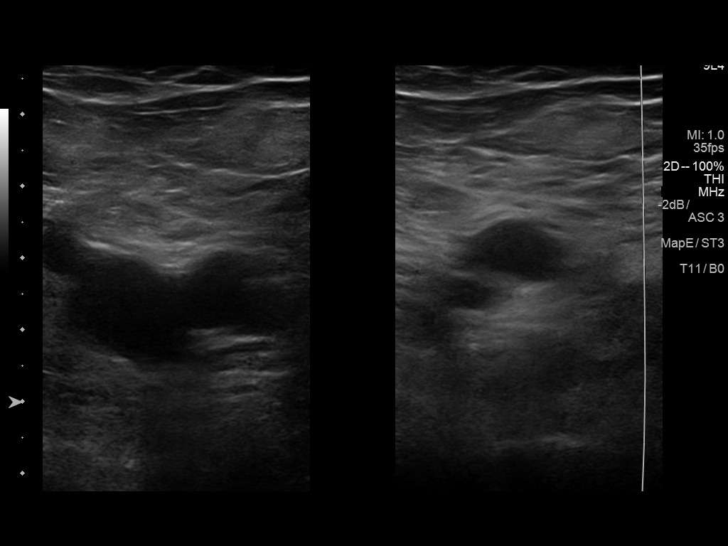
[im 4/37]
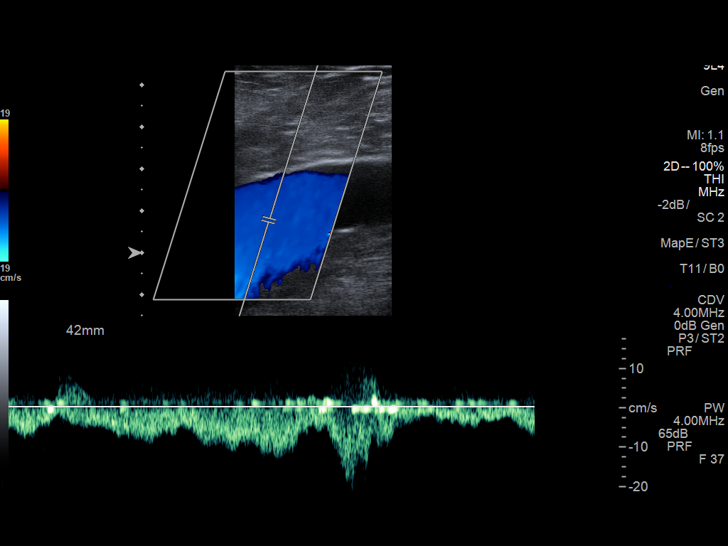
[im 7/37]
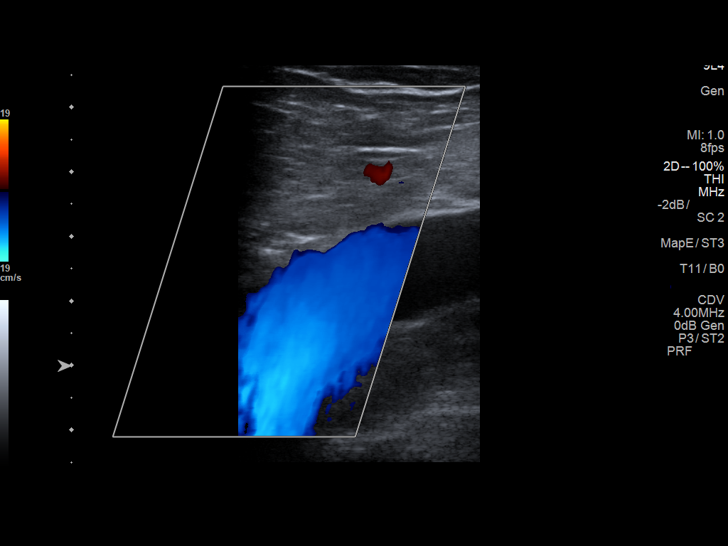
[im 10/37]
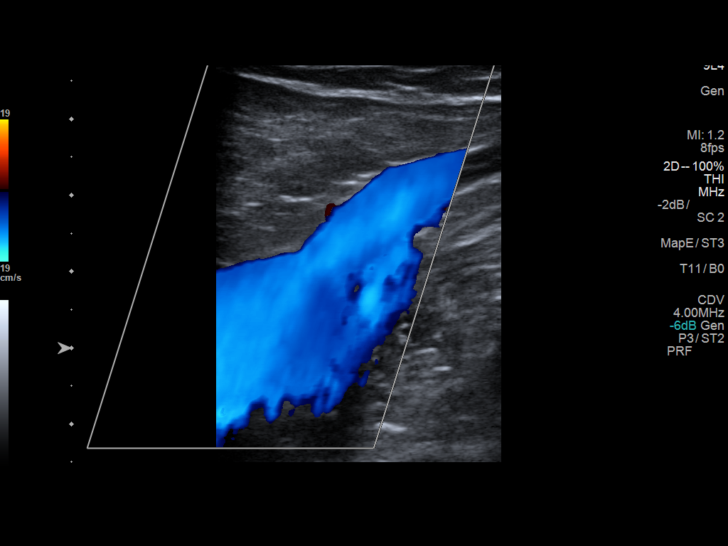
[im 13/37]
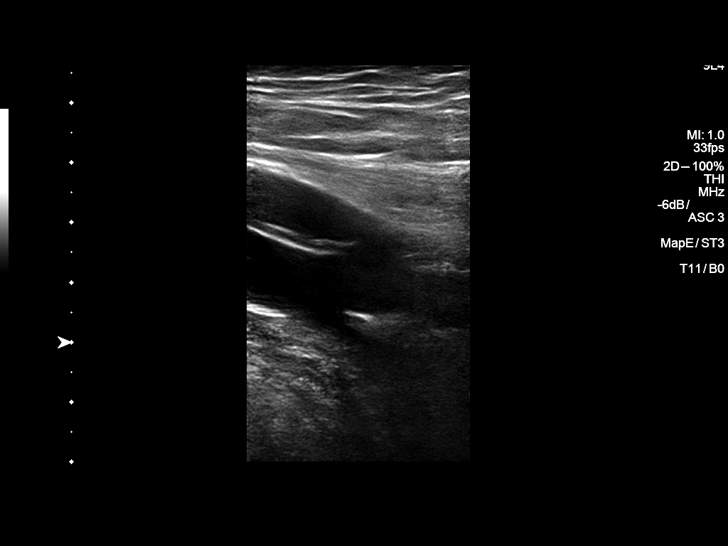
[im 16/37]
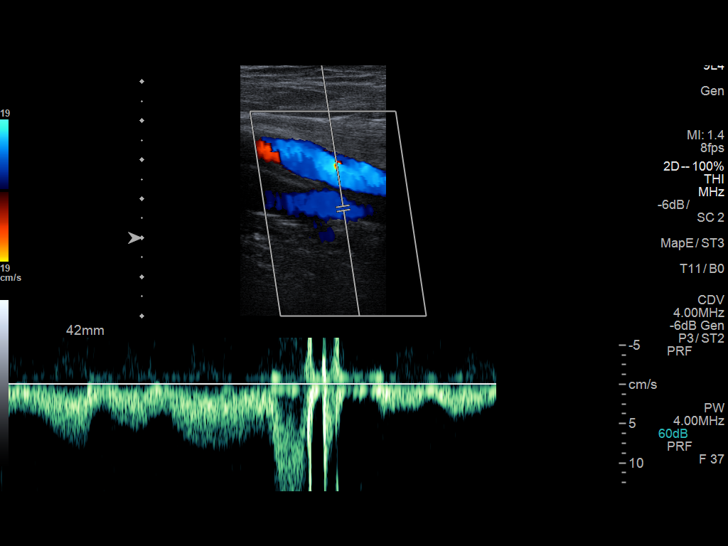
[im 19/37]
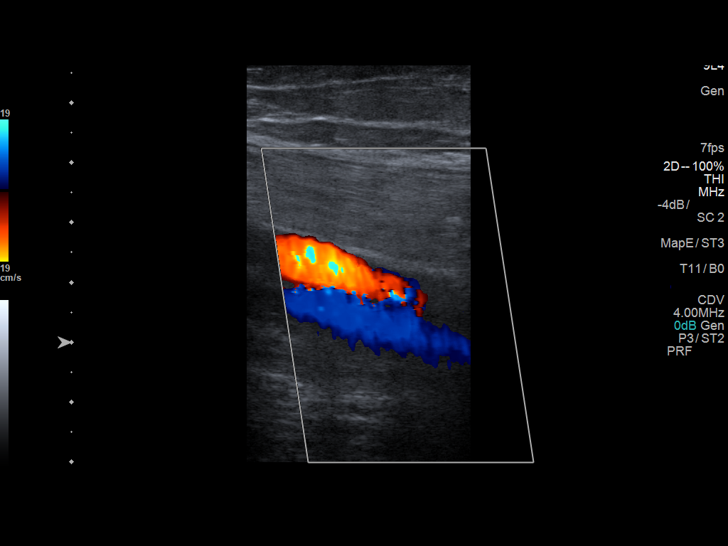
[im 21/37]
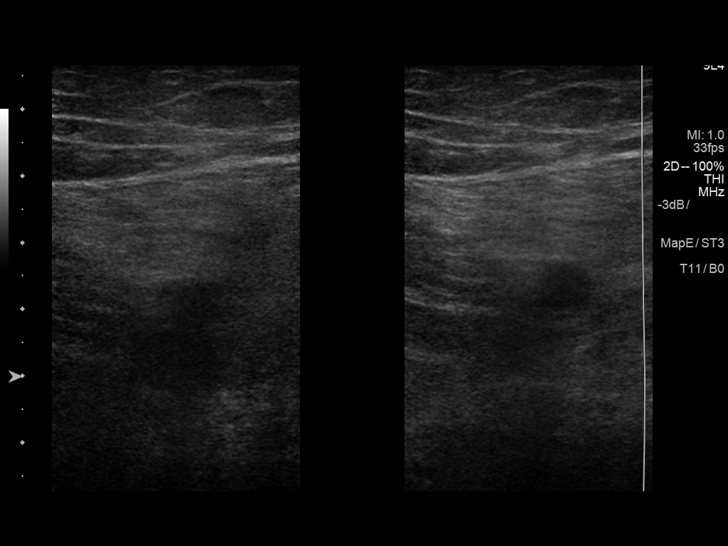
[im 24/37]
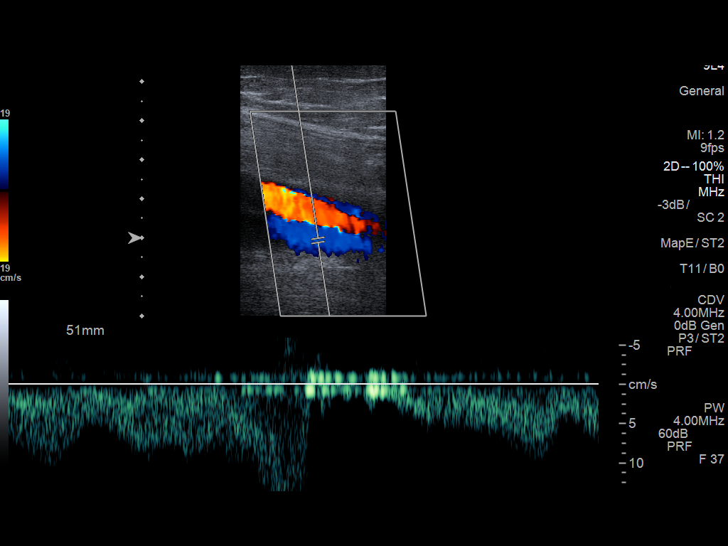
[im 27/37]
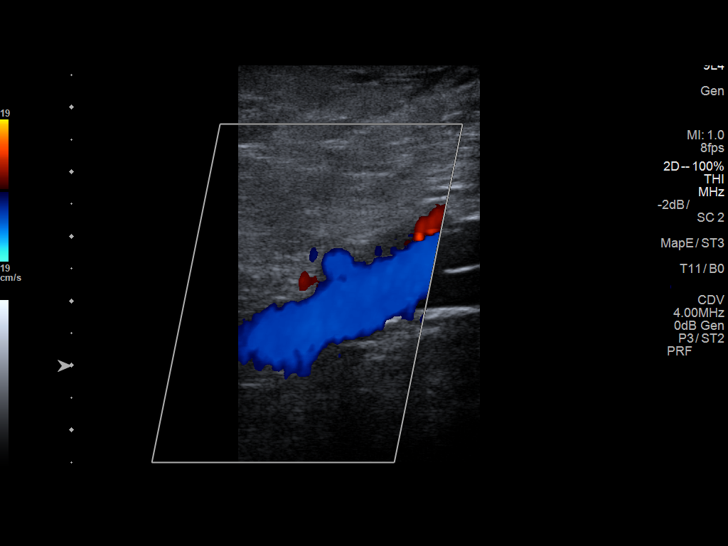
[im 30/37]
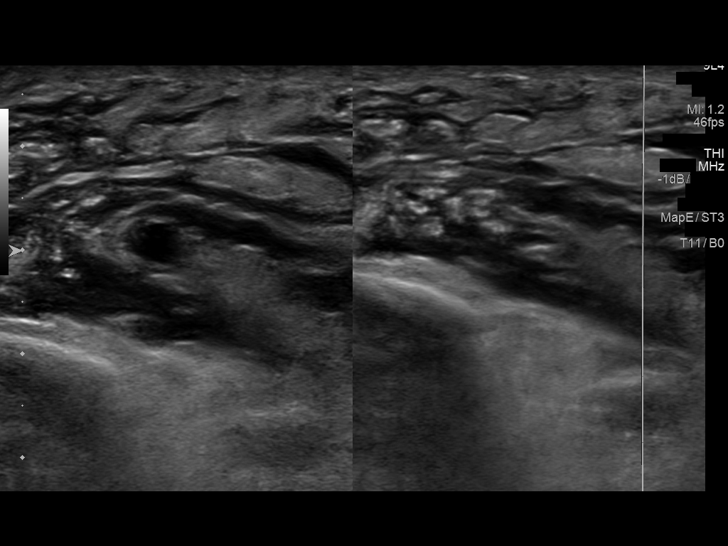
[im 33/37]
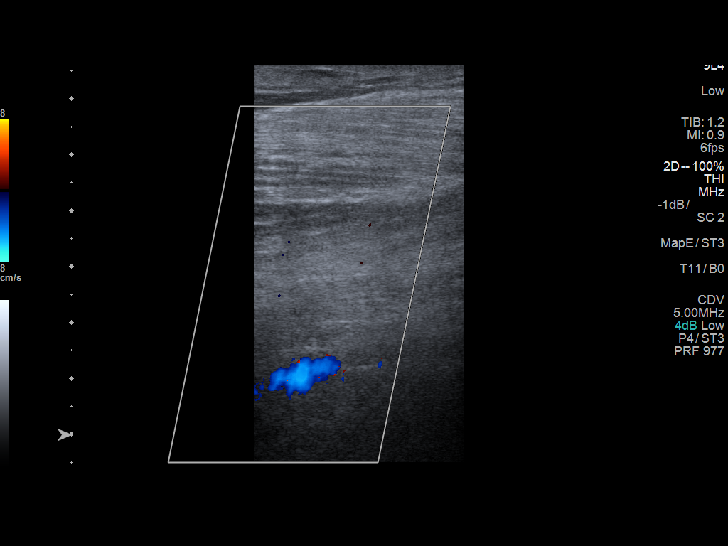
[im 37/37]
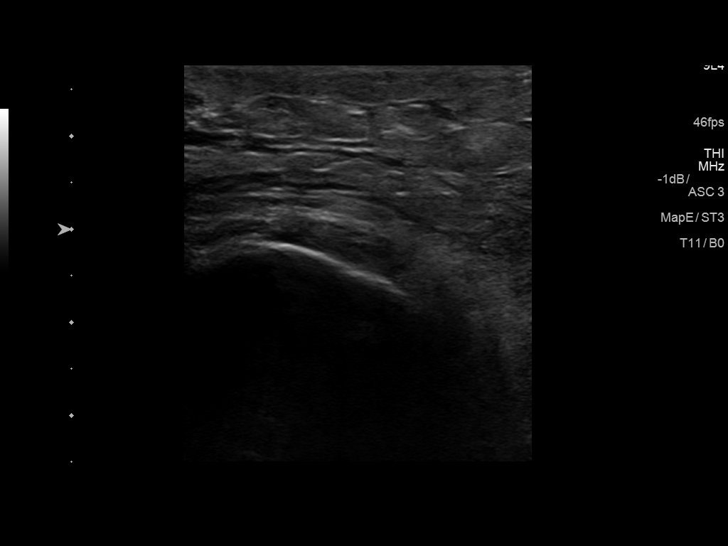

[13 of 24 positions shown; findings below may reference images not displayed]

FINDINGS: Contralateral Common Femoral Vein: Respiratory phasicity is normal
and symmetric with the symptomatic side. No evidence of thrombus.
Normal compressibility.

Common Femoral Vein: No evidence of thrombus. Normal
compressibility, respiratory phasicity and response to augmentation.

Saphenofemoral Junction: No evidence of thrombus. Normal
compressibility and flow on color Doppler imaging.

Profunda Femoral Vein: No evidence of thrombus. Normal
compressibility and flow on color Doppler imaging.

Femoral Vein: No evidence of thrombus. Normal compressibility,
respiratory phasicity and response to augmentation.

Popliteal Vein: No evidence of thrombus. Normal compressibility,
respiratory phasicity and response to augmentation.

Calf Veins: No evidence of thrombus. Normal compressibility and flow
on color Doppler imaging.

Superficial Great Saphenous Vein: No evidence of thrombus. Normal
compressibility and flow on color Doppler imaging.

Other Findings:  Edema of the right lower leg
IMPRESSION: Sonographic survey of the right lower extremity negative for DVT.

Right lower extremity superficial swelling of the lower leg.

## 2017-03-25 ENCOUNTER — Telehealth: Payer: Self-pay

## 2017-03-25 NOTE — Telephone Encounter (Signed)
Per Ginger, pt called to say he has to have carpel tunnel surgery before he has colonoscopy and he will call when he is ready.

## 2017-03-26 ENCOUNTER — Encounter: Payer: Self-pay | Admitting: Family Medicine

## 2017-04-06 DIAGNOSIS — G5602 Carpal tunnel syndrome, left upper limb: Secondary | ICD-10-CM | POA: Diagnosis not present

## 2017-04-20 ENCOUNTER — Other Ambulatory Visit: Payer: Self-pay | Admitting: Family Medicine

## 2017-07-06 ENCOUNTER — Other Ambulatory Visit: Payer: Self-pay | Admitting: Family Medicine

## 2017-07-06 NOTE — Telephone Encounter (Signed)
Last seen 03/17/17

## 2017-08-19 ENCOUNTER — Encounter: Payer: Self-pay | Admitting: Family Medicine

## 2017-08-19 ENCOUNTER — Ambulatory Visit (INDEPENDENT_AMBULATORY_CARE_PROVIDER_SITE_OTHER): Payer: 59 | Admitting: Family Medicine

## 2017-08-19 VITALS — BP 132/80 | Ht 72.0 in | Wt 274.0 lb

## 2017-08-19 DIAGNOSIS — F5101 Primary insomnia: Secondary | ICD-10-CM

## 2017-08-19 DIAGNOSIS — R600 Localized edema: Secondary | ICD-10-CM | POA: Diagnosis not present

## 2017-08-19 DIAGNOSIS — Z23 Encounter for immunization: Secondary | ICD-10-CM

## 2017-08-19 DIAGNOSIS — J441 Chronic obstructive pulmonary disease with (acute) exacerbation: Secondary | ICD-10-CM | POA: Diagnosis not present

## 2017-08-19 DIAGNOSIS — F411 Generalized anxiety disorder: Secondary | ICD-10-CM

## 2017-08-19 DIAGNOSIS — I10 Essential (primary) hypertension: Secondary | ICD-10-CM | POA: Diagnosis not present

## 2017-08-19 DIAGNOSIS — I878 Other specified disorders of veins: Secondary | ICD-10-CM

## 2017-08-19 DIAGNOSIS — Z0289 Encounter for other administrative examinations: Secondary | ICD-10-CM

## 2017-08-19 MED ORDER — FUROSEMIDE 20 MG PO TABS: ORAL_TABLET | ORAL | 5 refills | Status: DC

## 2017-08-19 MED ORDER — BUPROPION HCL ER (SR) 150 MG PO TB12: 150.0000 mg | ORAL_TABLET | Freq: Two times a day (BID) | ORAL | 5 refills | Status: DC

## 2017-08-19 MED ORDER — FLUTICASONE-SALMETEROL 115-21 MCG/ACT IN AERO: 2.0000 | INHALATION_SPRAY | Freq: Two times a day (BID) | RESPIRATORY_TRACT | 5 refills | Status: DC

## 2017-08-19 MED ORDER — AMLODIPINE BESY-BENAZEPRIL HCL 5-20 MG PO CAPS: 1.0000 | ORAL_CAPSULE | Freq: Every day | ORAL | 5 refills | Status: DC

## 2017-08-19 MED ORDER — ALBUTEROL SULFATE HFA 108 (90 BASE) MCG/ACT IN AERS: INHALATION_SPRAY | RESPIRATORY_TRACT | 5 refills | Status: DC

## 2017-08-19 MED ORDER — POTASSIUM CHLORIDE CRYS ER 20 MEQ PO TBCR: 20.0000 meq | EXTENDED_RELEASE_TABLET | Freq: Every morning | ORAL | 5 refills | Status: DC

## 2017-08-19 MED ORDER — ALPRAZOLAM 0.5 MG PO TABS: 0.2500 mg | ORAL_TABLET | Freq: Every evening | ORAL | 5 refills | Status: DC | PRN

## 2017-08-19 NOTE — Progress Notes (Signed)
   Subjective:    Patient ID: Ronald Juarez, male    DOB: 01/23/1956, 61 y.o.   MRN: [id number]  Hypertension  This is a recurrent problem. The current episode started more than 1 year ago. Improvement on treatment: pt takes lotrel.  Gets plenty of exercise,and diet could be better.  Blood pressure medicine and blood pressure levels reviewed today with patient. Compliant with blood pressure medicine. States does not miss a dose. No obvious side effects. Blood pressure generally good when checked elsewhere. Watching salt intake.  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  At times pt needs to take a day off and alow the swelling to go down Patient states he needs FMLA declaration because at times he has to leave work to go home and elevate his legs. And there are days where the swelling so bad that he has to stay away from work and keep his legs elevated   Copd is ovdrall stable, uses advair reventively and it cefinitely helps. Uses albuterol occasional days before work because it helps  Patient compliant with insomnia medication. Generally takes most nights. No obvious morning drowsiness. Definitely helps patient sleep. Without it patient states would not get a good nights rest.  Overall anxiety is stable on the meds,    Review of Systems No headache, no major weight loss or weight gain, no chest pain no back pain abdominal pain no change in bowel habits complete ROS otherwise negative     Objective:   Physical Exam Alert and oriented, vitals reviewed and stable, NAD ENT-TM's and ext canals WNL bilat via otoscopic exam Soft palate, tonsils and post pharynx WNL via oropharyngeal exam Neck-symmetric, no masses; thyroid nonpalpable and nontender Pulmonary-no tachypnea or accessory muscle use; Clear without wheezes via  auscultation Card--no abnrml murmurs, rhythm reg and rate WNL Carotid pulses symmetric, without bruits Legs and ankles positive chronic venous stasis changes. 1+ edema. Pulses good sensation intact       Assessment & Plan:  Impression #1 hypertension good control discussed maintain same meds  #2 chronic anxiety with definite need for medications with element of insomnia. Medicines refilled  #3 COPD. Uses Advair faithfully. Definitely helps. Occasional albuterol  #4 chronic venous stasis. Very bothersome the patient. Time precludes his ability to work. Discussed. We'll do   Meds refilled diet exercise discussed in encourage. Flu shot today. We'll do FMLA. Follow-up 6 months for wellness plus chronic

## 2017-08-21 ENCOUNTER — Telehealth: Payer: Self-pay | Admitting: Family Medicine

## 2017-08-21 NOTE — Telephone Encounter (Signed)
Patient dropped off FMLA on 9/5 and hasnt missed any days yet but wanting something in place when he does. I filled in what I could, please review form and answer highlighted areas.Was not sure how many days patient could be out of work with this condition flares up, that's in question 6 & 7.Date and sign in folder in office.

## 2017-08-26 ENCOUNTER — Emergency Department (HOSPITAL_BASED_OUTPATIENT_CLINIC_OR_DEPARTMENT_OTHER)
Admission: EM | Admit: 2017-08-26 | Discharge: 2017-08-27 | Disposition: A | Discharge status: Home / Self Care | Payer: Worker's Compensation | Attending: Physician Assistant | Admitting: Physician Assistant

## 2017-08-26 ENCOUNTER — Encounter (HOSPITAL_BASED_OUTPATIENT_CLINIC_OR_DEPARTMENT_OTHER): Payer: Self-pay | Admitting: Respiratory Therapy

## 2017-08-26 ENCOUNTER — Emergency Department (HOSPITAL_BASED_OUTPATIENT_CLINIC_OR_DEPARTMENT_OTHER): Payer: Worker's Compensation

## 2017-08-26 DIAGNOSIS — Z79899 Other long term (current) drug therapy: Secondary | ICD-10-CM | POA: Insufficient documentation

## 2017-08-26 DIAGNOSIS — S66829A Laceration of other specified muscles, fascia and tendons at wrist and hand level, unspecified hand, initial encounter: Secondary | ICD-10-CM

## 2017-08-26 DIAGNOSIS — S6991XA Unspecified injury of right wrist, hand and finger(s), initial encounter: Secondary | ICD-10-CM | POA: Diagnosis present

## 2017-08-26 DIAGNOSIS — Z87891 Personal history of nicotine dependence: Secondary | ICD-10-CM | POA: Insufficient documentation

## 2017-08-26 DIAGNOSIS — Y990 Civilian activity done for income or pay: Secondary | ICD-10-CM | POA: Diagnosis not present

## 2017-08-26 DIAGNOSIS — Y939 Activity, unspecified: Secondary | ICD-10-CM | POA: Diagnosis not present

## 2017-08-26 DIAGNOSIS — Y929 Unspecified place or not applicable: Secondary | ICD-10-CM | POA: Insufficient documentation

## 2017-08-26 DIAGNOSIS — I10 Essential (primary) hypertension: Secondary | ICD-10-CM | POA: Diagnosis not present

## 2017-08-26 DIAGNOSIS — J449 Chronic obstructive pulmonary disease, unspecified: Secondary | ICD-10-CM | POA: Insufficient documentation

## 2017-08-26 DIAGNOSIS — Z23 Encounter for immunization: Secondary | ICD-10-CM | POA: Diagnosis not present

## 2017-08-26 DIAGNOSIS — X58XXXA Exposure to other specified factors, initial encounter: Secondary | ICD-10-CM | POA: Insufficient documentation

## 2017-08-26 DIAGNOSIS — S61214A Laceration without foreign body of right ring finger without damage to nail, initial encounter: Secondary | ICD-10-CM | POA: Diagnosis not present

## 2017-08-26 MED ORDER — LIDOCAINE HCL (PF) 1 % IJ SOLN
5.0000 mL | Freq: Once | INTRAMUSCULAR | Status: AC
  Administered 2017-08-27: 5 mL

## 2017-08-26 MED ORDER — TETANUS-DIPHTH-ACELL PERTUSSIS 5-2.5-18.5 LF-MCG/0.5 IM SUSP
0.5000 mL | Freq: Once | INTRAMUSCULAR | Status: AC
  Administered 2017-08-26: 0.5 mL via INTRAMUSCULAR
  Filled 2017-08-26: qty 0.5

## 2017-08-26 NOTE — ED Provider Notes (Signed)
[facility] Provider Note   CSN: [id number] Arrival date & time: 08/26/17  2003     History   Chief Complaint Chief Complaint  Patient presents with  . Finger Injury    HPI Ronald Juarez is a 61 y.o. male with a history of hypertension who presents today for evaluation of a laceration to his right ring finger. He reports that his laceration occurred while he was at work when a large piece of sheet metal flipped around and cut his finger.  He is unsure when his last tetanus shot was. He reports that the bleeding is controlled with gauze.    HPI  Past Medical History:  Diagnosis Date  . Allergic rhinitis   . Anxiety   . ED (erectile dysfunction)   . Hypertension   . Insomnia   . PNA (pneumonia)   . Reactive airways dysfunction syndrome ([place])   . Stress     Patient Active Problem List   Diagnosis Date Noted  . Venous stasis 09/24/2015  . COPD exacerbation ([place]) 08/27/2015  . Essential hypertension, benign 03/09/2013  . Allergic rhinitis 03/09/2013  . Generalized anxiety disorder 03/09/2013    Past Surgical History:  Procedure Laterality Date  . pylondial cyst    . ROTATOR CUFF REPAIR         Home Medications    Prior to Admission medications   Medication Sig Start Date End Date Taking? Authorizing Provider  albuterol (PROAIR HFA) 108 (90 Base) MCG/ACT inhaler USE 2 PUFFS EVERY 6 HOURS AS NEEDED FOR WHEEZING. 08/19/17   Mikey Kirschner, MD  ALPRAZolam Duanne Moron) 0.5 MG tablet Take 0.5-1 tablets (0.25-0.5 mg total) by mouth at bedtime as needed. 08/19/17   Mikey Kirschner, MD  amLODipine-benazepril (LOTREL) 5-20 MG capsule Take 1 capsule by mouth daily. 08/19/17   Mikey Kirschner, MD  aspirin 81 MG tablet Take 81 mg by mouth daily.    [provider]  buPROPion (WELLBUTRIN SR) 150 MG 12 hr tablet Take 1 tablet (150 mg total) by mouth 2 (two) times daily. 08/19/17   Mikey Kirschner, MD  cephALEXin (KEFLEX) 500 MG capsule Take 1 capsule (500 mg  total) by mouth 4 (four) times daily. 08/27/17   Lorin Glass, PA-C  doxycycline (VIBRA-TABS) 100 MG tablet Take 1 tablet (100 mg total) by mouth 2 (two) times daily. 01/21/17   Mikey Kirschner, MD  fluticasone-salmeterol (ADVAIR HFA) [id number] MCG/ACT inhaler Inhale 2 puffs into the lungs 2 (two) times daily. 08/19/17   Mikey Kirschner, MD  furosemide (LASIX) 20 MG tablet take 2 tablets every morning for 3 days then 1 tablet every morning THEREAFTER. 08/19/17   Mikey Kirschner, MD  HYDROcodone-acetaminophen (NORCO/VICODIN) 5-325 MG tablet Take 1 tablet by mouth every 6 (six) hours as needed for severe pain. 08/27/17   Lorin Glass, PA-C  potassium chloride SA (K-DUR,KLOR-CON) 20 MEQ tablet Take 1 tablet (20 mEq total) by mouth every morning. 08/19/17   Mikey Kirschner, MD  triamcinolone cream (KENALOG) 0.1 % apply to affected area twice a day 07/07/17   Mikey Kirschner, MD  valACYclovir (VALTREX) 500 MG tablet take 1 tablet by mouth twice a day X3DAYS 10/01/16   Mikey Kirschner, MD  VIAGRA 100 MG tablet take 1 tablet by mouth 1 hour prior to intercourse 04/20/17   Mikey Kirschner, MD    Family History Family History  Problem Relation Age of Onset  . Heart disease Mother   .  Cancer Father     Social History Social History  Substance Use Topics  . Smoking status: Former Smoker    Packs/day: 1.00    Types: Cigarettes  . Smokeless tobacco: Never Used  . Alcohol use 0.0 oz/week     Comment: occ     Allergies   Bee venom; Hctz [hydrochlorothiazide]; and Lozol [indapamide]   Review of Systems Review of Systems  Constitutional: Negative for chills and fever.  HENT: Negative for ear pain and sore throat.   Eyes: Negative for pain and visual disturbance.  Respiratory: Negative for cough and shortness of breath.   Cardiovascular: Negative for chest pain and palpitations.  Gastrointestinal: Negative for abdominal pain and vomiting.  Genitourinary: Negative for dysuria and  hematuria.  Musculoskeletal: Negative for arthralgias and back pain.  Skin: Positive for wound. Negative for color change and rash.  Neurological: Negative for seizures and syncope.  All other systems reviewed and are negative.    Physical Exam Updated Vital Signs BP 137/84 (BP Location: Left Arm)   Pulse 73   Temp 98 F (36.7 C) (Oral)   Resp 18   SpO2 96%   Physical Exam  Constitutional: He appears well-developed and well-nourished. No distress.  HENT:  Head: Normocephalic and atraumatic.  Eyes: Conjunctivae are normal. Right eye exhibits no discharge. Left eye exhibits no discharge. No scleral icterus.  Neck: Normal range of motion.  Cardiovascular: Normal rate, regular rhythm and intact distal pulses.   Pulmonary/Chest: Effort normal. No stridor. No respiratory distress.  Abdominal: Soft. Bowel sounds are normal. He exhibits no distension.  Musculoskeletal: He exhibits no edema or deformity.  Patient is unable to actively flex his right ting finger DIP joint.    Neurological: He is alert. No sensory deficit. He exhibits normal muscle tone.  Skin: Skin is warm and dry. Capillary refill takes less than 2 seconds. He is not diaphoretic.  Psychiatric: He has a normal mood and affect. His behavior is normal.  Nursing note and vitals reviewed.    ED Treatments / Results  Labs (all labs ordered are listed, but only abnormal results are displayed) Labs Reviewed - No data to display  EKG  EKG Interpretation Juarez       Radiology Dg Finger Ring Right  Result Date: 08/26/2017 CLINICAL DATA:  Right ring finger pain after finger got caught in sheet metal machine at work tonight. EXAM: RIGHT RING FINGER 2+V COMPARISON:  Juarez. FINDINGS: There is no evidence of fracture or dislocation. There is no evidence of arthropathy or other focal bone abnormality. A dressing overlies the mid digit, no radiopaque foreign body. IMPRESSION: No fracture, dislocation, or radiopaque foreign body.  A dressing overlies the mid digit. Electronically Signed   By: Jeb Levering M.D.   On: 08/26/2017 22:00    Procedures .Marland KitchenLaceration Repair Date/Time: 08/27/2017 1:41 AM Performed by: Lorin Glass Authorized by: Lorin Glass   Consent:    Consent obtained:  Verbal   Consent given by:  Patient   Risks discussed:  Infection, nerve damage, tendon damage, vascular damage, pain, poor cosmetic result and need for additional repair (Informed he will need additional repair, that he appears to have tendon damage that may be made worse)   Alternatives discussed:  No treatment and referral Anesthesia (see MAR for exact dosages):    Anesthesia method:  Nerve block and local infiltration   Local anesthetic:  Lidocaine 1% w/o epi   Block anesthetic:  Lidocaine 1% w/o epi  Block technique:  Finger block   Block injection procedure:  Anatomic landmarks identified, introduced needle, negative aspiration for blood, incremental injection and anatomic landmarks palpated   Block outcome:  Anesthesia achieved Laceration details:    Location:  Finger   Finger location:  R ring finger   Length (cm):  2 Repair type:    Repair type:  Simple Pre-procedure details:    Preparation:  Patient was prepped and draped in usual sterile fashion and imaging obtained to evaluate for foreign bodies Exploration:    Hemostasis achieved with:  Tourniquet, epinephrine and direct pressure   Wound extent: tendon damage     Wound extent: no muscle damage noted, no nerve damage noted and no vascular damage noted     Tendon damage location:  Upper extremity   Upper extremity tendon damage location:  Finger flexor   Tendon repair plan:  Refer for evaluation   Contaminated: yes   Treatment:    Area cleansed with:  Saline (Chlorhexidine)   Amount of cleaning:  Extensive   Irrigation solution:  Sterile saline   Irrigation volume:  1 liter   Irrigation method:  Pressure wash Skin repair:    Repair method:   Sutures   Suture size:  4-0   Suture material:  Prolene   Suture technique: Simple interrupted, horizontal mattress.   Number of sutures:  4 Approximation:    Approximation:  Close Post-procedure details:    Dressing:  Antibiotic ointment, non-adherent dressing, sterile dressing and bulky dressing   Patient tolerance of procedure:  Tolerated well, no immediate complications   (including critical care time)  Medications Ordered in ED Medications  Tdap (BOOSTRIX) injection 0.5 mL (0.5 mLs Intramuscular Given 08/26/17 2242)  lidocaine (PF) (XYLOCAINE) 1 % injection 5 mL (5 mLs Infiltration Given by Other 08/27/17 0046)  lidocaine (XYLOCAINE) 1 % (with pres) injection (10 mLs  Given by Other 08/27/17 0004)     Initial Impression / Assessment and Plan / ED Course  I have reviewed the triage vital signs and the nursing notes.  Pertinent labs & imaging results that were available during my care of the patient were reviewed by me and considered in my medical decision making (see chart for details).  Clinical Course as of Aug 28 139  Wed Aug 26, 2017  2346 Spoke with Dr. Lenon Curt from hand surgery and requests closure of the skin, placing patient on antibiotics, and referral to his office tomorrow.  [EH]    Clinical Course User Index [EH] Lorin Glass, PA-C   Eveline Keto presents today for evaluation of laceration to his right ring finger that occurred when a piece of sheet metal slipped while at work.  On exam he has a 2 cm laceration across the palmar aspect just proximal to the DIP joint of his right ring finger. On exam he is unable to actively flex at the DIP joint, consistent with a flexor tendon rupture.  Dr.Coley from hand surgery was consulted, and request that the patient be placed on antibiotics, and that the skin for the laceration be closed and patient follow-up with him in his office.  This was explained to the patient. The skin was temporarily closed, please see procedure  note.  Patient is aware that he will most likely need a procedure to repair his flexor tendon.     At this time there does not appear to be any evidence of an acute emergency medical condition and the patient appears stable for discharge  with appropriate outpatient follow up.Diagnosis was discussed with patient who verbalizes understanding and is agreeable to discharge. Pt case discussed with Dr. Regenia Skeeter who agrees with my plan.    Final Clinical Impressions(s) / ED Diagnoses   Final diagnoses:  Laceration of right ring finger without foreign body without damage to nail, initial encounter  Laceration of flexor tendon of hand, initial encounter    New Prescriptions Discharge Medication List as of 08/27/2017 12:47 AM    START taking these medications   Details  cephALEXin (KEFLEX) 500 MG capsule Take 1 capsule (500 mg total) by mouth 4 (four) times daily., Starting Thu 08/27/2017, Print    HYDROcodone-acetaminophen (NORCO/VICODIN) 5-325 MG tablet Take 1 tablet by mouth every 6 (six) hours as needed for severe pain., Starting Thu 08/27/2017, Print         Lorin Glass, PA-C 08/27/17 0144    Sherwood Gambler, MD 08/28/17 1245

## 2017-08-26 NOTE — ED Notes (Signed)
Laceration to right ring finger measures 2 cm.

## 2017-08-26 NOTE — ED Triage Notes (Signed)
Pt states he cut right ring finger on "sheet metal piece" at work approx 40 min PTA-lac was noted and dressed by nurse first-pt NAD-steady gait

## 2017-08-27 MED ORDER — LIDOCAINE HCL 1 % IJ SOLN
INTRAMUSCULAR | Status: AC
  Administered 2017-08-27: 10 mL
  Filled 2017-08-27: qty 10

## 2017-08-27 MED ORDER — HYDROCODONE-ACETAMINOPHEN 5-325 MG PO TABS: 1.0000 | ORAL_TABLET | Freq: Four times a day (QID) | ORAL | 0 refills | Status: DC | PRN

## 2017-08-27 MED ORDER — CEPHALEXIN 500 MG PO CAPS: 500.0000 mg | ORAL_CAPSULE | Freq: Four times a day (QID) | ORAL | 0 refills | Status: DC

## 2017-08-27 NOTE — ED Notes (Signed)
ED Provider at bedside. 

## 2017-08-27 NOTE — Discharge Instructions (Signed)
You may have diarrhea from the antibiotics.  It is very important that you continue to take the antibiotics even if you get diarrhea unless a medical professional tells you that you may stop taking them.  If you stop too early the bacteria you are being treated for will become stronger and you may need different, more powerful antibiotics that have more side effects and worsening diarrhea.  Please stay well hydrated and consider probiotics as they may decrease the severity of your diarrhea.  Please be aware that if you take any hormonal contraception (birth control pills, nexplanon, the ring, etc) that your birth control will not work while you are taking antibiotics and you need to use back up protection as directed on the birth control medication information insert.   Please take Ibuprofen (Advil, motrin) and Tylenol (acetaminophen) to relieve your pain.  You may take up to 600 MG (3 pills) of normal strength ibuprofen every 8 hours as needed.  In between doses of ibuprofen you make take tylenol, up to 1,000 mg (two extra strength pills).  Do not take more than 3,000 mg tylenol in a 24 hour period.  Please check all medication labels as many medications such as pain and cold medications may contain tylenol.  Do not drink alcohol while taking these medications.  Do not take other NSAID'S while taking ibuprofen (such as aleve or naproxen).  Please take ibuprofen with food to decrease stomach upset.  You are being prescribed a medication which may make you sleepy. For 24 hours after one dose please do not drive, operate heavy machinery, care for a small child with out another adult present, or perform any activities that may cause harm to you or someone else if you were to fall asleep or be impaired.

## 2017-09-02 ENCOUNTER — Encounter (HOSPITAL_BASED_OUTPATIENT_CLINIC_OR_DEPARTMENT_OTHER): Payer: Self-pay | Admitting: *Deleted

## 2017-09-02 ENCOUNTER — Other Ambulatory Visit: Payer: Self-pay | Admitting: Orthopedic Surgery

## 2017-09-02 NOTE — Progress Notes (Signed)
   09/02/17 1427  OBSTRUCTIVE SLEEP APNEA  Have you ever been diagnosed with sleep apnea through a sleep study? No  Do you snore loudly (loud enough to be heard through closed doors)?  1  Do you often feel tired, fatigued, or sleepy during the daytime (such as falling asleep during driving or talking to someone)? 0  Has anyone observed you stop breathing during your sleep? 0  Do you have, or are you being treated for high blood pressure? 1  BMI more than 35 kg/m2? 1  Age > 39 (1-yes) 1  Male Gender (Yes=1) 1  Obstructive Sleep Apnea Score 5

## 2017-09-03 NOTE — H&P (Signed)
Ronald Juarez is an 61 y.o. male.   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  He was evaluated at [facility] on 08-26-17, where the wound was irrigated and closed.  He presents for further evaluation.  Past Medical History:  Diagnosis Date  . Allergic rhinitis   . Anxiety   . COPD (chronic obstructive pulmonary disease) (Ronald Juarez)   . Depression   . ED (erectile dysfunction)   . Finger laceration involving tendon    right ring finger flexor tendon  . Hypertension   . Insomnia   . PNA (pneumonia)   . Reactive airways dysfunction syndrome ([place])   . Stress     Past Surgical History:  Procedure Laterality Date  . pylondial cyst    . ROTATOR CUFF REPAIR      Family History  Problem Relation Age of Onset  . Heart disease Mother   . Cancer Father    Social History:  reports that he has quit smoking. His smoking use included Cigarettes. He smoked 1.00 pack per day. He has never used smokeless tobacco. He reports that he drinks alcohol. He reports that he does not use drugs.  Allergies:  Allergies  Allergen Reactions  . Bee Venom   . Hctz [Hydrochlorothiazide]     Dizziness and sycope  . Lozol [Indapamide]     Dizziness and syncope    No prescriptions prior to admission.    No results found for this or any previous visit (from the past 48 hour(s)). No results found.  Review of Systems  All other systems reviewed and are negative.   Height 6' (1.829 m), weight 124.3 kg (274 lb). Physical Exam  Constitutional:  WD, WN, NAD HEENT:  NCAT, EOMI Neuro/Psych:  Alert & oriented to person, place, and time; appropriate mood & affect Lymphatic: No generalized UE edema or lymphadenopathy Extremities / MSK:  Both UE are normal with respect to appearance, ranges of motion, joint  stability, muscle strength/tone, sensation, & perfusion except as otherwise noted:  Right ring finger has a 2 cm volar oblique laceration over the distal aspect of the middle phalanx.  Intact light touch sensibility radially and ulnarly, although noted to be very slightly tingly on the radial side.  No active DIP flexion, good isolated FDS testing.  Labs / Xrays:  No radiographic studies obtained today.  Assessment: Right ring finger FDP laceration with intact FDS  Plan:  I discussed these findings with him and reviewed options.  I recommended primary surgical repair followed by flexor tendon rehab.  We discussed what this entails and how involve the rehabilitation is for an injury such as this.  We will seek workers comp authorization to proceed, and proceed likely on Monday, 09-07-17.  The details of the operative procedure were discussed with the patient.  Questions were invited and answered.  In addition to the goal of the procedure, the risks of the procedure to include but not limited to bleeding; infection; damage to the nerves or blood vessels that could result in bleeding, numbness, weakness, chronic pain, and the need for additional procedures; stiffness; the need for revision surgery; and anesthetic risks were reviewed.  No specific outcome was guaranteed or implied.  Informed consent was obtained.  Jguadalupe Opiela A., MD 09/03/2017, 8:52 PM

## 2017-09-04 ENCOUNTER — Encounter (HOSPITAL_BASED_OUTPATIENT_CLINIC_OR_DEPARTMENT_OTHER)
Admission: RE | Admit: 2017-09-04 | Discharge: 2017-09-04 | Disposition: A | Discharge status: Home / Self Care | Payer: 59 | Source: Ambulatory Visit | Attending: Orthopedic Surgery | Admitting: Orthopedic Surgery

## 2017-09-04 DIAGNOSIS — Z87891 Personal history of nicotine dependence: Secondary | ICD-10-CM | POA: Diagnosis not present

## 2017-09-04 DIAGNOSIS — W458XXA Other foreign body or object entering through skin, initial encounter: Secondary | ICD-10-CM | POA: Diagnosis not present

## 2017-09-04 DIAGNOSIS — S66124A Laceration of flexor muscle, fascia and tendon of right ring finger at wrist and hand level, initial encounter: Secondary | ICD-10-CM | POA: Diagnosis present

## 2017-09-04 DIAGNOSIS — Y990 Civilian activity done for income or pay: Secondary | ICD-10-CM | POA: Diagnosis not present

## 2017-09-04 DIAGNOSIS — Z7982 Long term (current) use of aspirin: Secondary | ICD-10-CM | POA: Diagnosis not present

## 2017-09-04 DIAGNOSIS — F329 Major depressive disorder, single episode, unspecified: Secondary | ICD-10-CM | POA: Diagnosis not present

## 2017-09-04 DIAGNOSIS — Z79899 Other long term (current) drug therapy: Secondary | ICD-10-CM | POA: Diagnosis not present

## 2017-09-04 DIAGNOSIS — I10 Essential (primary) hypertension: Secondary | ICD-10-CM | POA: Diagnosis not present

## 2017-09-04 DIAGNOSIS — J449 Chronic obstructive pulmonary disease, unspecified: Secondary | ICD-10-CM | POA: Diagnosis not present

## 2017-09-04 DIAGNOSIS — F419 Anxiety disorder, unspecified: Secondary | ICD-10-CM | POA: Diagnosis not present

## 2017-09-04 LAB — BASIC METABOLIC PANEL
ANION GAP: 5 (ref 5–15)
BUN: 12 mg/dL (ref 6–20)
CO2: 29 mmol/L (ref 22–32)
Calcium: 9 mg/dL (ref 8.9–10.3)
Chloride: 105 mmol/L (ref 101–111)
Creatinine, Ser: 0.93 mg/dL (ref 0.61–1.24)
GFR calc Af Amer: >60 mL/min (ref >60)
GLUCOSE: 130 mg/dL — AB (ref 65–99)
POTASSIUM: 4 mmol/L (ref 3.5–5.1)
Sodium: 139 mmol/L (ref 135–145)

## 2017-09-07 ENCOUNTER — Ambulatory Visit (HOSPITAL_BASED_OUTPATIENT_CLINIC_OR_DEPARTMENT_OTHER): Payer: Worker's Compensation | Admitting: Anesthesiology

## 2017-09-07 ENCOUNTER — Encounter (HOSPITAL_BASED_OUTPATIENT_CLINIC_OR_DEPARTMENT_OTHER)
Admission: RE | Disposition: A | Discharge status: Home / Self Care | Payer: Self-pay | Source: Ambulatory Visit | Attending: Orthopedic Surgery

## 2017-09-07 ENCOUNTER — Encounter (HOSPITAL_BASED_OUTPATIENT_CLINIC_OR_DEPARTMENT_OTHER): Payer: Self-pay | Admitting: Anesthesiology

## 2017-09-07 ENCOUNTER — Ambulatory Visit (HOSPITAL_BASED_OUTPATIENT_CLINIC_OR_DEPARTMENT_OTHER)
Admission: RE | Admit: 2017-09-07 | Discharge: 2017-09-07 | Disposition: A | Discharge status: Home / Self Care | Payer: Worker's Compensation | Source: Ambulatory Visit | Attending: Orthopedic Surgery | Admitting: Orthopedic Surgery

## 2017-09-07 DIAGNOSIS — S66124A Laceration of flexor muscle, fascia and tendon of right ring finger at wrist and hand level, initial encounter: Secondary | ICD-10-CM | POA: Diagnosis not present

## 2017-09-07 DIAGNOSIS — J449 Chronic obstructive pulmonary disease, unspecified: Secondary | ICD-10-CM | POA: Diagnosis not present

## 2017-09-07 DIAGNOSIS — Z79899 Other long term (current) drug therapy: Secondary | ICD-10-CM | POA: Insufficient documentation

## 2017-09-07 DIAGNOSIS — W458XXA Other foreign body or object entering through skin, initial encounter: Secondary | ICD-10-CM | POA: Insufficient documentation

## 2017-09-07 DIAGNOSIS — F419 Anxiety disorder, unspecified: Secondary | ICD-10-CM | POA: Diagnosis not present

## 2017-09-07 DIAGNOSIS — Z7982 Long term (current) use of aspirin: Secondary | ICD-10-CM | POA: Insufficient documentation

## 2017-09-07 DIAGNOSIS — I10 Essential (primary) hypertension: Secondary | ICD-10-CM | POA: Insufficient documentation

## 2017-09-07 DIAGNOSIS — Y990 Civilian activity done for income or pay: Secondary | ICD-10-CM | POA: Insufficient documentation

## 2017-09-07 DIAGNOSIS — F329 Major depressive disorder, single episode, unspecified: Secondary | ICD-10-CM | POA: Diagnosis not present

## 2017-09-07 DIAGNOSIS — Z87891 Personal history of nicotine dependence: Secondary | ICD-10-CM | POA: Insufficient documentation

## 2017-09-07 HISTORY — PX: FLEXOR TENDON REPAIR: SHX6501

## 2017-09-07 HISTORY — DX: Major depressive disorder, single episode, unspecified: F32.9

## 2017-09-07 HISTORY — DX: Laceration without foreign body of unspecified finger without damage to nail, initial encounter: S61.219A

## 2017-09-07 HISTORY — DX: Chronic obstructive pulmonary disease, unspecified: J44.9

## 2017-09-07 HISTORY — DX: Depression, unspecified: F32.A

## 2017-09-07 SURGERY — REPAIR, TENDON, FLEXOR
Anesthesia: General | Site: Finger | Laterality: Right

## 2017-09-07 MED ORDER — PHENYLEPHRINE HCL 10 MG/ML IJ SOLN
INTRAMUSCULAR | Status: DC | PRN
  Administered 2017-09-07: 80 ug via INTRAVENOUS

## 2017-09-07 MED ORDER — BUPIVACAINE-EPINEPHRINE 0.5% -1:200000 IJ SOLN
INTRAMUSCULAR | Status: DC | PRN
  Administered 2017-09-07: 10 mL

## 2017-09-07 MED ORDER — LIDOCAINE 2% (20 MG/ML) 5 ML SYRINGE
INTRAMUSCULAR | Status: AC
  Filled 2017-09-07: qty 5

## 2017-09-07 MED ORDER — PROMETHAZINE HCL 25 MG/ML IJ SOLN: 6.2500 mg | INTRAMUSCULAR | Status: DC | PRN

## 2017-09-07 MED ORDER — LIDOCAINE HCL (PF) 1 % IJ SOLN
INTRAMUSCULAR | Status: AC
Start: 2017-09-07 — End: 2017-09-07
  Filled 2017-09-07: qty 30

## 2017-09-07 MED ORDER — ONDANSETRON HCL 4 MG/2ML IJ SOLN
INTRAMUSCULAR | Status: DC | PRN
  Administered 2017-09-07: 4 mg via INTRAVENOUS

## 2017-09-07 MED ORDER — FENTANYL CITRATE (PF) 100 MCG/2ML IJ SOLN: 25.0000 ug | INTRAMUSCULAR | Status: DC | PRN

## 2017-09-07 MED ORDER — SCOPOLAMINE 1 MG/3DAYS TD PT72: 1.0000 | MEDICATED_PATCH | Freq: Once | TRANSDERMAL | Status: DC | PRN

## 2017-09-07 MED ORDER — 0.9 % SODIUM CHLORIDE (POUR BTL) OPTIME
TOPICAL | Status: DC | PRN
Start: 2017-09-07 — End: 2017-09-07
  Administered 2017-09-07: 300 mL

## 2017-09-07 MED ORDER — FENTANYL CITRATE (PF) 100 MCG/2ML IJ SOLN
INTRAMUSCULAR | Status: AC
  Filled 2017-09-07: qty 2

## 2017-09-07 MED ORDER — BUPIVACAINE HCL (PF) 0.5 % IJ SOLN
INTRAMUSCULAR | Status: AC
  Filled 2017-09-07: qty 30

## 2017-09-07 MED ORDER — PROPOFOL 10 MG/ML IV BOLUS
INTRAVENOUS | Status: AC
  Filled 2017-09-07: qty 20

## 2017-09-07 MED ORDER — LACTATED RINGERS IV SOLN: INTRAVENOUS | Status: DC

## 2017-09-07 MED ORDER — KETOROLAC TROMETHAMINE 30 MG/ML IJ SOLN: 30.0000 mg | Freq: Once | INTRAMUSCULAR | Status: AC | PRN

## 2017-09-07 MED ORDER — DEXAMETHASONE SODIUM PHOSPHATE 10 MG/ML IJ SOLN
INTRAMUSCULAR | Status: AC
  Filled 2017-09-07: qty 1

## 2017-09-07 MED ORDER — DEXAMETHASONE SODIUM PHOSPHATE 10 MG/ML IJ SOLN
INTRAMUSCULAR | Status: DC | PRN
  Administered 2017-09-07: 10 mg via INTRAVENOUS

## 2017-09-07 MED ORDER — CEFAZOLIN SODIUM-DEXTROSE 2-4 GM/100ML-% IV SOLN
2.0000 g | INTRAVENOUS | Status: AC
  Administered 2017-09-07: 2 g via INTRAVENOUS

## 2017-09-07 MED ORDER — LACTATED RINGERS IV SOLN
INTRAVENOUS | Status: DC
  Administered 2017-09-07 (×2): via INTRAVENOUS

## 2017-09-07 MED ORDER — CEFAZOLIN SODIUM-DEXTROSE 2-4 GM/100ML-% IV SOLN
INTRAVENOUS | Status: AC
  Filled 2017-09-07: qty 100

## 2017-09-07 MED ORDER — PROPOFOL 10 MG/ML IV BOLUS
INTRAVENOUS | Status: DC | PRN
Start: 2017-09-07 — End: 2017-09-07
  Administered 2017-09-07: 230 mg via INTRAVENOUS
  Administered 2017-09-07: 70 mg via INTRAVENOUS

## 2017-09-07 MED ORDER — FENTANYL CITRATE (PF) 100 MCG/2ML IJ SOLN: 50.0000 ug | INTRAMUSCULAR | Status: DC | PRN

## 2017-09-07 MED ORDER — LIDOCAINE HCL (CARDIAC) 20 MG/ML IV SOLN
INTRAVENOUS | Status: DC | PRN
  Administered 2017-09-07: 30 mg via INTRAVENOUS

## 2017-09-07 MED ORDER — ONDANSETRON HCL 4 MG/2ML IJ SOLN
INTRAMUSCULAR | Status: AC
  Filled 2017-09-07: qty 2

## 2017-09-07 MED ORDER — PHENYLEPHRINE 40 MCG/ML (10ML) SYRINGE FOR IV PUSH (FOR BLOOD PRESSURE SUPPORT)
PREFILLED_SYRINGE | INTRAVENOUS | Status: AC
  Filled 2017-09-07: qty 10

## 2017-09-07 MED ORDER — FENTANYL CITRATE (PF) 100 MCG/2ML IJ SOLN
INTRAMUSCULAR | Status: DC | PRN
  Administered 2017-09-07 (×2): 50 ug via INTRAVENOUS
  Administered 2017-09-07: 25 ug via INTRAVENOUS
  Administered 2017-09-07: 50 ug via INTRAVENOUS

## 2017-09-07 MED ORDER — MIDAZOLAM HCL 5 MG/5ML IJ SOLN
INTRAMUSCULAR | Status: DC | PRN
  Administered 2017-09-07: 2 mg via INTRAVENOUS

## 2017-09-07 MED ORDER — MIDAZOLAM HCL 2 MG/2ML IJ SOLN
INTRAMUSCULAR | Status: AC
Start: 2017-09-07 — End: 2017-09-07
  Filled 2017-09-07: qty 2

## 2017-09-07 MED ORDER — BUPIVACAINE-EPINEPHRINE (PF) 0.5% -1:200000 IJ SOLN
INTRAMUSCULAR | Status: AC
  Filled 2017-09-07: qty 30

## 2017-09-07 MED ORDER — MIDAZOLAM HCL 2 MG/2ML IJ SOLN: 1.0000 mg | INTRAMUSCULAR | Status: DC | PRN

## 2017-09-07 SURGICAL SUPPLY — 60 items
BLADE MINI RND TIP GREEN BEAV (BLADE) IMPLANT
BLADE SURG 15 STRL LF DISP TIS (BLADE) ×1 IMPLANT
BLADE SURG 15 STRL SS (BLADE) ×1
BNDG COHESIVE 4X5 TAN STRL (GAUZE/BANDAGES/DRESSINGS) ×2 IMPLANT
BNDG ESMARK 4X9 LF (GAUZE/BANDAGES/DRESSINGS) ×2 IMPLANT
BNDG GAUZE ELAST 4 BULKY (GAUZE/BANDAGES/DRESSINGS) ×2 IMPLANT
CHLORAPREP W/TINT 26ML (MISCELLANEOUS) ×2 IMPLANT
CORD BIPOLAR FORCEPS 12FT (ELECTRODE) ×2 IMPLANT
COVER BACK TABLE 60X90IN (DRAPES) ×2 IMPLANT
COVER MAYO STAND STRL (DRAPES) ×2 IMPLANT
CUFF TOURNIQUET SINGLE 18IN (TOURNIQUET CUFF) ×2 IMPLANT
DECANTER SPIKE VIAL GLASS SM (MISCELLANEOUS) IMPLANT
DRAIN PENROSE 1/4X12 LTX STRL (WOUND CARE) IMPLANT
DRAPE EXTREMITY T 121X128X90 (DRAPE) ×2 IMPLANT
DRAPE SURG 17X23 STRL (DRAPES) ×2 IMPLANT
DRSG EMULSION OIL 3X3 NADH (GAUZE/BANDAGES/DRESSINGS) ×2 IMPLANT
ELECT REM PT RETURN 9FT ADLT (ELECTROSURGICAL)
ELECTRODE REM PT RTRN 9FT ADLT (ELECTROSURGICAL) IMPLANT
GAUZE SPONGE 4X4 12PLY STRL LF (GAUZE/BANDAGES/DRESSINGS) ×2 IMPLANT
GLOVE BIO SURGEON STRL SZ7.5 (GLOVE) ×2 IMPLANT
GLOVE BIOGEL PI IND STRL 7.0 (GLOVE) ×3 IMPLANT
GLOVE BIOGEL PI IND STRL 8 (GLOVE) ×1 IMPLANT
GLOVE BIOGEL PI INDICATOR 7.0 (GLOVE) ×3
GLOVE BIOGEL PI INDICATOR 8 (GLOVE) ×1
GLOVE ECLIPSE 6.5 STRL STRAW (GLOVE) ×4 IMPLANT
GOWN STRL REUS W/ TWL LRG LVL3 (GOWN DISPOSABLE) ×2 IMPLANT
GOWN STRL REUS W/TWL LRG LVL3 (GOWN DISPOSABLE) ×2
GOWN STRL REUS W/TWL XL LVL3 (GOWN DISPOSABLE) ×2 IMPLANT
LOOP VESSEL MAXI BLUE (MISCELLANEOUS) IMPLANT
LOOP VESSEL MINI RED (MISCELLANEOUS) IMPLANT
NEEDLE HYPO 25X1 1.5 SAFETY (NEEDLE) ×2 IMPLANT
NS IRRIG 1000ML POUR BTL (IV SOLUTION) ×2 IMPLANT
PACK BASIN DAY SURGERY FS (CUSTOM PROCEDURE TRAY) ×2 IMPLANT
PADDING CAST ABS 4INX4YD NS (CAST SUPPLIES) ×1
PADDING CAST ABS COTTON 4X4 ST (CAST SUPPLIES) ×1 IMPLANT
PENCIL BUTTON HOLSTER BLD 10FT (ELECTRODE) IMPLANT
RUBBERBAND STERILE (MISCELLANEOUS) ×2 IMPLANT
SLEEVE SCD COMPRESS KNEE MED (MISCELLANEOUS) ×2 IMPLANT
SLING ARM FOAM STRAP LRG (SOFTGOODS) IMPLANT
SPLINT PLASTER CAST XFAST 3X15 (CAST SUPPLIES) ×7 IMPLANT
SPLINT PLASTER XTRA FASTSET 3X (CAST SUPPLIES) ×7
STOCKINETTE 6  STRL (DRAPES) ×1
STOCKINETTE 6 STRL (DRAPES) ×1 IMPLANT
SUT FIBERWIRE 2-0 18 17.9 3/8 (SUTURE)
SUT MERSILENE 4 0 P 3 (SUTURE) IMPLANT
SUT PROLENE 6 0 P 1 18 (SUTURE) ×2 IMPLANT
SUT SILK 3 0 SH CR/8 (SUTURE) ×2 IMPLANT
SUT SILK 4 0 PS 2 (SUTURE) IMPLANT
SUT STEEL 4 (SUTURE) IMPLANT
SUT SUPRAMID 3-0 (SUTURE) ×2 IMPLANT
SUT VICRYL 3-0 CR8 SH (SUTURE) IMPLANT
SUT VICRYL RAPIDE 4-0 (SUTURE) IMPLANT
SUT VICRYL RAPIDE 4/0 PS 2 (SUTURE) ×4 IMPLANT
SUTURE FIBERWR 2-0 18 17.9 3/8 (SUTURE) IMPLANT
SYR 10ML LL (SYRINGE) IMPLANT
SYR BULB 3OZ (MISCELLANEOUS) ×2 IMPLANT
TOWEL OR 17X24 6PK STRL BLUE (TOWEL DISPOSABLE) ×2 IMPLANT
TUBE CONNECTING 20X1/4 (TUBING) IMPLANT
TUBE FEEDING 8FR 16IN STR KANG (MISCELLANEOUS) IMPLANT
UNDERPAD 30X30 (UNDERPADS AND DIAPERS) ×2 IMPLANT

## 2017-09-07 NOTE — Anesthesia Preprocedure Evaluation (Signed)
Anesthesia Evaluation  Patient identified by MRN, date of birth, ID band Patient awake    Reviewed: Allergy & Precautions, NPO status , Patient's Chart, lab work & pertinent test results  Airway Mallampati: II  TM Distance: >3 FB Neck ROM: Full    Dental no notable dental hx.    Pulmonary COPD, former smoker,    Pulmonary exam normal breath sounds clear to auscultation       Cardiovascular hypertension, Normal cardiovascular exam Rhythm:Regular Rate:Normal     Neuro/Psych negative neurological ROS  negative psych ROS   GI/Hepatic negative GI ROS, Neg liver ROS,   Endo/Other  negative endocrine ROS  Renal/GU negative Renal ROS  negative genitourinary   Musculoskeletal negative musculoskeletal ROS (+)   Abdominal   Peds negative pediatric ROS (+)  Hematology negative hematology ROS (+)   Anesthesia Other Findings   Reproductive/Obstetrics negative OB ROS                             Anesthesia Physical Anesthesia Plan  ASA: II  Anesthesia Plan: General   Post-op Pain Management:    Induction: Intravenous  PONV Risk Score and Plan: 1 and Ondansetron and Dexamethasone  Airway Management Planned: LMA  Additional Equipment:   Intra-op Plan:   Post-operative Plan: Extubation in OR  Informed Consent: I have reviewed the patients History and Physical, chart, labs and discussed the procedure including the risks, benefits and alternatives for the proposed anesthesia with the patient or authorized representative who has indicated his/her understanding and acceptance.   Dental advisory given  Plan Discussed with: CRNA and Surgeon  Anesthesia Plan Comments:         Anesthesia Quick Evaluation

## 2017-09-07 NOTE — Transfer of Care (Signed)
Immediate Anesthesia Transfer of Care Note  Patient: Ronald Juarez  Procedure(s) Performed: Procedure(s): RIGHT RING FINGER FLEXOR TENDON REPAIR (Right)  Patient Location: PACU  Anesthesia Type:General  Level of Consciousness: awake and patient cooperative  Airway & Oxygen Therapy: Patient Spontanous Breathing and Patient connected to face mask oxygen  Post-op Assessment: Report given to RN and Post -op Vital signs reviewed and stable  Post vital signs: Reviewed and stable  Last Vitals:  Vitals:   09/07/17 0734  BP: 137/79  Pulse: 70  Resp: 18  Temp: 36.6 C  SpO2: 99%    Last Pain:  Vitals:   09/07/17 0734  TempSrc: Oral         Complications: No apparent anesthesia complications

## 2017-09-07 NOTE — Anesthesia Procedure Notes (Signed)
Procedure Name: LMA Insertion Date/Time: 09/07/2017 7:54 AM Performed by: Toula Moos L Pre-anesthesia Checklist: Patient identified, Emergency Drugs available, Suction available, Patient being monitored and Timeout performed Patient Re-evaluated:Patient Re-evaluated prior to induction Oxygen Delivery Method: Circle system utilized Preoxygenation: Pre-oxygenation with 100% oxygen Induction Type: IV induction Ventilation: Mask ventilation without difficulty LMA: LMA inserted LMA Size: 5.0 Number of attempts: 1 Airway Equipment and Method: Bite block Placement Confirmation: positive ETCO2 Tube secured with: Tape Dental Injury: Teeth and Oropharynx as per pre-operative assessment

## 2017-09-07 NOTE — Op Note (Signed)
09/07/2017  7:46 AM  PATIENT:  Ronald Juarez  61 y.o. male  PRE-OPERATIVE DIAGNOSIS:  Right RF flexor tendon and annular pulley laceration  POST-OPERATIVE DIAGNOSIS:  Same  PROCEDURE:   1.  R RF FDP repair with intact FDS    2.  R RF A4 pulley reconstruction with 1 ulnar slip of FDS    3.  Removal of sutures from right ring finger    4.  Excisional debridement of skin from traumatic wound of right ring finger    5.  Simple closure of right ring finger traumatic laceration, 1.5 cm.  SURGEON: Rayvon Char. Grandville Silos, MD  PHYSICIAN ASSISTANT: Morley Kos, OPA-C  ANESTHESIA:  general  SPECIMENS:  None  DRAINS:   None  EBL:  less than 50 mL  PREOPERATIVE INDICATIONS:  Ronald Juarez is a  61 y.o. male with a laceration of the R RF, with suspected FDP laceration  The risks benefits and alternatives were discussed with the patient preoperatively including but not limited to the risks of infection, bleeding, nerve injury, cardiopulmonary complications, the need for revision surgery, among others, and the patient verbalized understanding and consented to proceed.  OPERATIVE IMPLANTS: none  OPERATIVE PROCEDURE:  After receiving prophylactic antibiotics, the patient was escorted to the operative theatre and placed in a supine position.  General anesthesia was administered.  A surgical "time-out" was performed during which the planned procedure, proposed operative site, and the correct patient identity were compared to the operative consent and agreement confirmed by the circulating nurse according to current facility policy.  Following application of a tourniquet to the operative extremity, the exposed skin was prepped with Chloraprep and draped in the usual sterile fashion.  Half percent Marcaine with epinephrine was instilled at the base the digit to provide for postoperative pain control.  The limb was exsanguinated with an Esmarch bandage and the tourniquet inflated to approximately 186mmHg  higher than systolic BP.  Sutures were removed from the traumatic laceration.  The traumatic wound was extended proximally and distally in a standard Bruner zigzag fashion, extending to the midportion of P1.  In the process, the skin margins of the traumatic laceration were excised, to include skin only.  The flaps were reflected full-thickness, observing and protecting the digital neurovascular structures which were identified to be intact.  Flaps retracted with atraumatic technique, placing retention tag sutures within them.  The flexor sheath was identified, the distal stump identified, and nearly complete transection of the A4 pulley was identified.  This was further released as the tendon repair was obligatory at that level.  Using a Environmental manager, proximal stump was identified and pulled the field.  25-gauge hypodermic needle was placed through it proximally, bringing it into the repair site and allowing it to remain there without tension.  A 4 strand repair of 3-0 Supramid was performed with loop suture in the a method as described by Truman Hayward.  1-2 mm of tendon on each end of the repair was resected first, an effort to try to get back to better, less mushy tendon.  A 6-0 Prolene epineurial running repair was performed, with a Silverskold technique.  The ulnar slip of the FDS was then divided more proximally, and it was used to reconstruct and A4 pulley, placed obliquely from proximal ulnar to distal radial, where 3-0 Supramid was used to secure it to the radial remnant of the A4 native pulley.  A portion of the ulnar pulley was then secured to this with  6-0 Prolene, tacking it nicely into place.  An effort was made to place it at an appropriate tension to provide some annular pulley restraint but not encircle the FDP to snugly to allow for excursion.  Full proximal excursion was evident.  The tourniquet was released, additional hemostasis and necessary and the wound was irrigated copiously.  The  laceration was reapproximated with 4-0 Vicryl Rapide interrupted sutures.  The surgical incision was reapproximated with 4-0 Vicryl Rapide interrupted sutures.  A short arm splint dressing was applied with a dorsal plaster component, that allowed the thumb, index, and long fingers to be free, placing the MP joints of the ring and small fingers in flexion.  He was awakened and taken to the recovery room stable condition, breathing spontaneously.  DISPOSITION: He'll be discharged home today with typical instructions, returning later this week to therapy to have his splint removed, splint fabricated, and initiation of rehabilitation.  Follow-up next week with me for clinical and splint check.

## 2017-09-07 NOTE — Anesthesia Postprocedure Evaluation (Signed)
Anesthesia Post Note  Patient: Ronald Juarez  Procedure(s) Performed: Procedure(s) (LRB): RIGHT RING FINGER FLEXOR TENDON REPAIR (Right)     Patient location during evaluation: PACU Anesthesia Type: General Level of consciousness: awake and alert Pain management: pain level controlled Vital Signs Assessment: post-procedure vital signs reviewed and stable Respiratory status: spontaneous breathing, nonlabored ventilation, respiratory function stable and patient connected to nasal cannula oxygen Cardiovascular status: blood pressure returned to baseline and stable Postop Assessment: no apparent nausea or vomiting Anesthetic complications: no    Last Vitals:  Vitals:   09/07/17 0945 09/07/17 1014  BP: 128/85 140/85  Pulse: 70 71  Resp: 16 16  Temp:  36.7 C  SpO2: 95% 96%    Last Pain:  Vitals:   09/07/17 0945  TempSrc:   PainSc: 0-No pain                 [name]

## 2017-09-07 NOTE — Discharge Instructions (Addendum)
Discharge Instructions   You have a dressing with a plaster splint incorporated in it. Move your fingers as much as possible within the confines of the splint, making a full fist and opening the fist. Elevate your hand to reduce pain & swelling of the digits.  Ice over the operative site may be helpful to reduce pain & swelling.  DO NOT USE HEAT. Pain medicine has been prescribed for you.  Take tylenol 650 mg and Ibuprofen 600 mg over the counter together every 6 hours. Take prescribed pain medicine for severe pain only as a rescue medicine. If you utilize your rescue medicine, cut back on the tylenol to only 325 mg because hydrocodone also has tylenol 325 mg.  Leave the dressing in place until you return to our office.  You may shower, but keep the bandage clean & dry.  You may drive a car when you are off of prescription pain medications and can safely control your vehicle with both hands. Our office will call you to arrange follow-up   Please call [phone] during normal business hours or [phone] after hours for any problems. Including the following:  - excessive redness of the incisions - drainage for more than 4 days - fever of more than 101.5 F  *Please note that pain medications will not be refilled after hours or on weekends.  WORK STATUS: With the right hand, no lifting, gripping, or grasping greater than pencil and paper tasks. Must wear splint always. Return to work on 09/10/2017      Postoperative Anesthesia Instructions-Pediatric  Activity: Your child should rest for the remainder of the day. A responsible individual must stay with your child for 24 hours.  Meals: Your child should start with liquids and light foods such as gelatin or soup unless otherwise instructed by the physician. Progress to regular foods as tolerated. Avoid spicy, greasy, and heavy foods. If nausea and/or vomiting occur, drink only clear liquids such as apple juice or Pedialyte until the  nausea and/or vomiting subsides. Call your physician if vomiting continues.  Special Instructions/Symptoms: Your child may be drowsy for the rest of the day, although some children experience some hyperactivity a few hours after the surgery. Your child may also experience some irritability or crying episodes due to the operative procedure and/or anesthesia. Your child's throat may feel dry or sore from the anesthesia or the breathing tube placed in the throat during surgery. Use throat lozenges, sprays, or ice chips if needed.

## 2017-09-07 NOTE — Interval H&P Note (Signed)
History and Physical Interval Note:  09/07/2017 7:46 AM  Ronald Juarez  has presented today for surgery, with the diagnosis of [place], [phone]  The various methods of treatment have been discussed with the patient and family. After consideration of risks, benefits and other options for treatment, the patient has consented to  Procedure(s): RIGHT [place] (Right) as a surgical intervention .  The patient's history has been reviewed, patient examined, no change in status, stable for surgery.  I have reviewed the patient's chart and labs.  Questions were answered to the patient's satisfaction.     [name] A.

## 2017-09-08 ENCOUNTER — Encounter (HOSPITAL_BASED_OUTPATIENT_CLINIC_OR_DEPARTMENT_OTHER): Payer: Self-pay | Admitting: Orthopedic Surgery

## 2017-11-26 ENCOUNTER — Other Ambulatory Visit: Payer: Self-pay | Admitting: Family Medicine

## 2018-03-18 ENCOUNTER — Encounter: Payer: 59 | Admitting: Family Medicine

## 2018-03-22 ENCOUNTER — Encounter: Payer: Self-pay | Admitting: Family Medicine

## 2018-03-22 ENCOUNTER — Ambulatory Visit (INDEPENDENT_AMBULATORY_CARE_PROVIDER_SITE_OTHER): Payer: 59 | Admitting: Family Medicine

## 2018-03-22 VITALS — BP 126/74 | Ht 72.0 in | Wt 261.0 lb

## 2018-03-22 DIAGNOSIS — Z0001 Encounter for general adult medical examination with abnormal findings: Secondary | ICD-10-CM | POA: Diagnosis not present

## 2018-03-22 DIAGNOSIS — I878 Other specified disorders of veins: Secondary | ICD-10-CM

## 2018-03-22 DIAGNOSIS — Z79899 Other long term (current) drug therapy: Secondary | ICD-10-CM | POA: Diagnosis not present

## 2018-03-22 DIAGNOSIS — Z125 Encounter for screening for malignant neoplasm of prostate: Secondary | ICD-10-CM | POA: Diagnosis not present

## 2018-03-22 DIAGNOSIS — I10 Essential (primary) hypertension: Secondary | ICD-10-CM | POA: Diagnosis not present

## 2018-03-22 DIAGNOSIS — Z13220 Encounter for screening for lipoid disorders: Secondary | ICD-10-CM

## 2018-03-22 DIAGNOSIS — Z0000 Encounter for general adult medical examination without abnormal findings: Secondary | ICD-10-CM

## 2018-03-22 MED ORDER — AMLODIPINE BESY-BENAZEPRIL HCL 5-20 MG PO CAPS: 1.0000 | ORAL_CAPSULE | Freq: Every day | ORAL | 5 refills | Status: DC

## 2018-03-22 MED ORDER — BUPROPION HCL ER (SR) 150 MG PO TB12: 150.0000 mg | ORAL_TABLET | Freq: Two times a day (BID) | ORAL | 5 refills | Status: DC

## 2018-03-22 MED ORDER — EPINEPHRINE 0.3 MG/0.3ML IJ SOAJ
0.3000 mg | Freq: Once | INTRAMUSCULAR | 0 refills | Status: AC
Start: 2018-03-22 — End: 2018-03-22

## 2018-03-22 MED ORDER — FLUTICASONE-SALMETEROL 115-21 MCG/ACT IN AERO: 2.0000 | INHALATION_SPRAY | Freq: Two times a day (BID) | RESPIRATORY_TRACT | 5 refills | Status: DC

## 2018-03-22 MED ORDER — POTASSIUM CHLORIDE CRYS ER 20 MEQ PO TBCR: 20.0000 meq | EXTENDED_RELEASE_TABLET | Freq: Every morning | ORAL | 5 refills | Status: DC

## 2018-03-22 MED ORDER — FUROSEMIDE 20 MG PO TABS: ORAL_TABLET | ORAL | 5 refills | Status: DC

## 2018-03-22 MED ORDER — ALPRAZOLAM 0.5 MG PO TABS: 0.2500 mg | ORAL_TABLET | Freq: Every evening | ORAL | 5 refills | Status: DC | PRN

## 2018-03-22 NOTE — Progress Notes (Signed)
   Subjective:    Patient ID: Ronald Juarez, male    DOB: 1956/11/06, 62 y.o.   MRN: [id number]  HPI The patient comes in today for a wellness visit.    A review of their health history was completed.  A review of medications was also completed.  Any needed refills; xanax  Eating habits: health conscious  Falls/  MVA accidents in past few months: none  Regular exercise: walking  Specialist pt sees on regular basis: none  Preventative health issues were discussed.   Additional concerns: none  Blood pressure medicine and blood pressure levels reviewed today with patient. Compliant with blood pressure medicine. States does not miss a dose. No obvious side effects. Blood pressure generally good when checked elsewhere. Watching salt intake.  Breathing still a challenge   Exercising a fair amnt   Pt notes achiness in joints knees and hips after sitting for a while   Using b p cks at the nurses with numb around 782 or 956 systolic   Over upper 21H Review of Systems  Constitutional: Negative for activity change, appetite change and fever.  HENT: Negative for congestion and rhinorrhea.   Eyes: Negative for discharge.  Respiratory: Negative for cough and wheezing.   Cardiovascular: Negative for chest pain.  Gastrointestinal: Negative for abdominal pain, blood in stool and vomiting.  Genitourinary: Negative for difficulty urinating and frequency.  Musculoskeletal: Negative for neck pain.  Skin: Negative for rash.  Allergic/Immunologic: Negative for environmental allergies and food allergies.  Neurological: Negative for weakness and headaches.  Psychiatric/Behavioral: Negative for agitation.  All other systems reviewed and are negative.      Objective:   Physical Exam  Constitutional: He appears well-developed and well-nourished.  HENT:  Head: Normocephalic and atraumatic.  Right Ear: External ear normal.  Left Ear: External ear normal.  Nose: Nose normal.    Mouth/Throat: Oropharynx is clear and moist.  Eyes: Right eye exhibits no discharge. Left eye exhibits no discharge. No scleral icterus.  Neck: Normal range of motion. Neck supple. No thyromegaly present.  Cardiovascular: Normal rate, regular rhythm and normal heart sounds.  No murmur heard. Pulmonary/Chest: Effort normal and breath sounds normal. No respiratory distress. He has no wheezes.  Abdominal: Soft. Bowel sounds are normal. He exhibits no distension and no mass. There is no tenderness.  Genitourinary: Penis normal.  Musculoskeletal: Normal range of motion. He exhibits no edema.  Lymphadenopathy:    He has no cervical adenopathy.  Neurological: He is alert. He exhibits normal muscle tone. Coordination normal.  Skin: Skin is warm and dry. No erythema.  Psychiatric: He has a normal mood and affect. His behavior is normal. Judgment normal.  Vitals reviewed.         Assessment & Plan:  Pt scheduling colonoscopy  wellness.  Diet exercise discussed.  Patient to schedule colonoscopy on his own.  2.  Hypertension good control discussed compliance discussed maintain same medications  3.  Chronic anxiety current meds helping to maintain discussed  Further recommendations based on lab results.  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  This was done

## 2018-03-23 ENCOUNTER — Telehealth: Payer: Self-pay | Admitting: Family Medicine

## 2018-03-23 LAB — BASIC METABOLIC PANEL
BUN / CREAT RATIO: 15 (ref 10–24)
BUN: 11 mg/dL (ref 8–27)
CHLORIDE: 104 mmol/L (ref 96–106)
CO2: 24 mmol/L (ref 20–29)
Calcium: 9.1 mg/dL (ref 8.6–10.2)
Creatinine, Ser: 0.72 mg/dL — ABNORMAL LOW (ref 0.76–1.27)
GFR calc Af Amer: 116 mL/min/{1.73_m2} (ref >59)
GFR calc non Af Amer: 101 mL/min/{1.73_m2} (ref >59)
Glucose: 95 mg/dL (ref 65–99)
POTASSIUM: 4.6 mmol/L (ref 3.5–5.2)
SODIUM: 141 mmol/L (ref 134–144)

## 2018-03-23 LAB — HEPATIC FUNCTION PANEL
ALK PHOS: 67 IU/L (ref 39–117)
ALT: 30 IU/L (ref 0–44)
AST: 27 IU/L (ref 0–40)
Albumin: 4.3 g/dL (ref 3.6–4.8)
BILIRUBIN, DIRECT: 0.1 mg/dL (ref 0.00–0.40)
Bilirubin Total: 0.4 mg/dL (ref 0.0–1.2)
Total Protein: 6.8 g/dL (ref 6.0–8.5)

## 2018-03-23 LAB — LIPID PANEL
Chol/HDL Ratio: 3.6 ratio (ref 0.0–5.0)
Cholesterol, Total: 175 mg/dL (ref 100–199)
HDL: 49 mg/dL (ref >39)
LDL Calculated: 107 mg/dL — ABNORMAL HIGH (ref 0–99)
TRIGLYCERIDES: 93 mg/dL (ref 0–149)
VLDL Cholesterol Cal: 19 mg/dL (ref 5–40)

## 2018-03-23 LAB — PSA: Prostate Specific Ag, Serum: 0.8 ng/mL (ref 0.0–4.0)

## 2018-03-23 NOTE — Telephone Encounter (Signed)
[facility] needing labs from yesterday patient is having surgery on Friday.The labs are in just waiting on your result notes.

## 2018-03-23 NOTE — Telephone Encounter (Signed)
done

## 2018-03-25 ENCOUNTER — Encounter: Payer: Self-pay | Admitting: Family Medicine

## 2018-04-18 ENCOUNTER — Other Ambulatory Visit: Payer: Self-pay | Admitting: Family Medicine

## 2018-04-19 NOTE — Telephone Encounter (Signed)
Six mo ok 

## 2018-07-05 ENCOUNTER — Other Ambulatory Visit: Payer: Self-pay | Admitting: Family Medicine

## 2018-07-13 ENCOUNTER — Other Ambulatory Visit: Payer: Self-pay | Admitting: Family Medicine

## 2018-08-14 DIAGNOSIS — Z23 Encounter for immunization: Secondary | ICD-10-CM | POA: Diagnosis not present

## 2018-09-04 ENCOUNTER — Other Ambulatory Visit: Payer: Self-pay | Admitting: Family Medicine

## 2018-09-05 ENCOUNTER — Emergency Department (HOSPITAL_COMMUNITY)
Admission: EM | Admit: 2018-09-05 | Discharge: 2018-09-05 | Disposition: A | Discharge status: Home / Self Care | Payer: 59 | Attending: Emergency Medicine | Admitting: Emergency Medicine

## 2018-09-05 ENCOUNTER — Other Ambulatory Visit: Payer: Self-pay

## 2018-09-05 DIAGNOSIS — Z7982 Long term (current) use of aspirin: Secondary | ICD-10-CM | POA: Diagnosis not present

## 2018-09-05 DIAGNOSIS — M7021 Olecranon bursitis, right elbow: Secondary | ICD-10-CM | POA: Diagnosis not present

## 2018-09-05 DIAGNOSIS — F419 Anxiety disorder, unspecified: Secondary | ICD-10-CM | POA: Insufficient documentation

## 2018-09-05 DIAGNOSIS — M25422 Effusion, left elbow: Secondary | ICD-10-CM | POA: Diagnosis not present

## 2018-09-05 DIAGNOSIS — J449 Chronic obstructive pulmonary disease, unspecified: Secondary | ICD-10-CM | POA: Diagnosis not present

## 2018-09-05 DIAGNOSIS — R2232 Localized swelling, mass and lump, left upper limb: Secondary | ICD-10-CM | POA: Diagnosis present

## 2018-09-05 DIAGNOSIS — Z79899 Other long term (current) drug therapy: Secondary | ICD-10-CM | POA: Insufficient documentation

## 2018-09-05 DIAGNOSIS — I10 Essential (primary) hypertension: Secondary | ICD-10-CM | POA: Insufficient documentation

## 2018-09-05 DIAGNOSIS — Z87891 Personal history of nicotine dependence: Secondary | ICD-10-CM | POA: Insufficient documentation

## 2018-09-05 DIAGNOSIS — L03114 Cellulitis of left upper limb: Secondary | ICD-10-CM | POA: Diagnosis not present

## 2018-09-05 DIAGNOSIS — F329 Major depressive disorder, single episode, unspecified: Secondary | ICD-10-CM | POA: Diagnosis not present

## 2018-09-05 LAB — CBC WITH DIFFERENTIAL/PLATELET
ABS IMMATURE GRANULOCYTES: 0 10*3/uL (ref 0.0–0.1)
BASOS ABS: 0 10*3/uL (ref 0.0–0.1)
Basophils Relative: 0 %
Eosinophils Absolute: 0.2 10*3/uL (ref 0.0–0.7)
Eosinophils Relative: 2 %
HCT: 44.1 % (ref 39.0–52.0)
HEMOGLOBIN: 14.3 g/dL (ref 13.0–17.0)
IMMATURE GRANULOCYTES: 0 %
Lymphocytes Relative: 17 %
Lymphs Abs: 1.7 10*3/uL (ref 0.7–4.0)
MCH: 31.6 pg (ref 26.0–34.0)
MCHC: 32.4 g/dL (ref 30.0–36.0)
MCV: 97.4 fL (ref 78.0–100.0)
MONO ABS: 0.9 10*3/uL (ref 0.1–1.0)
Monocytes Relative: 9 %
NEUTROS ABS: 7.1 10*3/uL (ref 1.7–7.7)
NEUTROS PCT: 72 %
PLATELETS: 221 10*3/uL (ref 150–400)
RBC: 4.53 MIL/uL (ref 4.22–5.81)
RDW: 13.3 % (ref 11.5–15.5)
WBC: 9.9 10*3/uL (ref 4.0–10.5)

## 2018-09-05 LAB — BASIC METABOLIC PANEL
ANION GAP: 8 (ref 5–15)
BUN: 16 mg/dL (ref 8–23)
CHLORIDE: 107 mmol/L (ref 98–111)
CO2: 25 mmol/L (ref 22–32)
Calcium: 8.6 mg/dL — ABNORMAL LOW (ref 8.9–10.3)
Creatinine, Ser: 0.85 mg/dL (ref 0.61–1.24)
Glucose, Bld: 98 mg/dL (ref 70–99)
Potassium: 4 mmol/L (ref 3.5–5.1)
SODIUM: 140 mmol/L (ref 135–145)

## 2018-09-05 LAB — SEDIMENTATION RATE: SED RATE: 18 mm/h — AB (ref 0–16)

## 2018-09-05 MED ORDER — LIDOCAINE HCL 2 % IJ SOLN
5.0000 mL | Freq: Once | INTRAMUSCULAR | Status: AC
  Administered 2018-09-05: 100 mg
  Filled 2018-09-05: qty 20

## 2018-09-05 MED ORDER — CEPHALEXIN 250 MG PO CAPS
1000.0000 mg | ORAL_CAPSULE | Freq: Once | ORAL | Status: AC
  Administered 2018-09-05: 1000 mg via ORAL
  Filled 2018-09-05: qty 4

## 2018-09-05 MED ORDER — CEPHALEXIN 500 MG PO CAPS: 500.0000 mg | ORAL_CAPSULE | Freq: Four times a day (QID) | ORAL | 0 refills | Status: DC

## 2018-09-05 NOTE — Discharge Instructions (Signed)
Return if you start running a fever, or if the redness and swelling start going into your upper arm.

## 2018-09-05 NOTE — ED Notes (Signed)
edp at bedside  

## 2018-09-05 NOTE — ED Triage Notes (Signed)
Patient c/o insect bite on elbow Friday night, which is now red and swollen.

## 2018-09-05 NOTE — ED Provider Notes (Signed)
[facility] EMERGENCY DEPARTMENT Provider Note   CSN: [id number] Arrival date & time: 09/05/18  0302     History   Chief Complaint Chief Complaint  Patient presents with  . Insect Bite    HPI Ronald Juarez is a 62 y.o. male.  The history is provided by the patient.  He has history of hypertension, COPD and comes in with redness and swelling of the left elbow and forearm.  He states that 2 days ago, he thought he got bitten on his left elbow some kind of insect.  He noted some redness and swelling.  Yesterday, his wife poked the area with a pen and drained a large amount of pus.  Through the course of the day, he had aggressive redness and swelling of the entire left forearm.  It hurts if he moves it the wrong way or if somebody pushes on it but it is not painful at rest.  He denies fever or chills.  Past Medical History:  Diagnosis Date  . Allergic rhinitis   . Anxiety   . COPD (chronic obstructive pulmonary disease) ([place])   . Depression   . ED (erectile dysfunction)   . Finger laceration involving tendon    right ring finger flexor tendon  . Hypertension   . Insomnia   . PNA (pneumonia)   . Reactive airways dysfunction syndrome ([place])   . Stress     Patient Active Problem List   Diagnosis Date Noted  . Venous stasis 09/24/2015  . COPD exacerbation ([place]) 08/27/2015  . Essential hypertension, benign 03/09/2013  . Allergic rhinitis 03/09/2013  . Generalized anxiety disorder 03/09/2013    Past Surgical History:  Procedure Laterality Date  . FLEXOR TENDON REPAIR Right 09/07/2017   Procedure: RIGHT RING FINGER FLEXOR TENDON REPAIR;  Surgeon: Milly Jakob, MD;  Location: [facility];  Service: Orthopedics;  Laterality: Right;  . pylondial cyst    . ROTATOR CUFF REPAIR          Home Medications    Prior to Admission medications   Medication Sig Start Date End Date Taking? Authorizing Provider  albuterol (PROAIR HFA) 108 (90 Base)  MCG/ACT inhaler USE 2 PUFFS EVERY 6 HOURS AS NEEDED FOR WHEEZING. Patient taking differently: Inhale 2 puffs into the lungs every 6 (six) hours as needed for wheezing.  08/19/17  Yes Mikey Kirschner, MD  ALPRAZolam Duanne Moron) 0.5 MG tablet Take 0.5-1 tablets (0.25-0.5 mg total) by mouth at bedtime as needed. Patient taking differently: Take 0.25-0.5 mg by mouth at bedtime as needed for anxiety.  03/22/18  Yes Mikey Kirschner, MD  amLODipine-benazepril (LOTREL) 5-20 MG capsule TAKE 1 CAPSULE ONCE DAILY Patient taking differently: Take 1 capsule by mouth daily.  04/19/18  Yes Mikey Kirschner, MD  aspirin 81 MG tablet Take 81 mg by mouth daily.   Yes [provider]  buPROPion (WELLBUTRIN SR) 150 MG 12 hr tablet TAKE 1 TABLET TWICE A DAY Patient taking differently: Take 300 mg by mouth daily.  04/19/18  Yes Mikey Kirschner, MD  fluticasone-salmeterol (ADVAIR HFA) [phone] MCG/ACT inhaler Inhale 2 puffs into the lungs 2 (two) times daily. 03/22/18  Yes Mikey Kirschner, MD  furosemide (LASIX) 20 MG tablet TAKE 2 TABLETS BY MOUTH EVERY MORNING FOR 3 DAYS, THEN 1 TABLET EVERY MORNING Patient taking differently: Take 20 mg by mouth daily.  07/13/18  Yes Mikey Kirschner, MD  potassium chloride SA (K-DUR,KLOR-CON) 20 MEQ tablet Take 1  tablet (20 mEq total) by mouth every morning. Patient taking differently: Take 20 mEq by mouth daily.  03/22/18  Yes Mikey Kirschner, MD  sildenafil (VIAGRA) 100 MG tablet TAKE 1 TABLET BY MOUTH 1 HOUR PRIOR TO INTERCOURSE Patient not taking: Reported on 09/05/2018 07/05/18   Mikey Kirschner, MD  triamcinolone cream (KENALOG) 0.1 % apply to affected area twice a day Patient not taking: Reported on 09/05/2018 07/07/17   Mikey Kirschner, MD  valACYclovir (VALTREX) 500 MG tablet take 1 tablet by mouth twice a day for 3 days Patient not taking: Reported on 09/05/2018 11/26/17   Mikey Kirschner, MD    Family History Family History  Problem Relation Age of Onset  . Heart  disease Mother   . Cancer Father     Social History Social History   Tobacco Use  . Smoking status: Former Smoker    Packs/day: 1.00    Types: Cigarettes  . Smokeless tobacco: Never Used  Substance Use Topics  . Alcohol use: Yes    Alcohol/week: 0.0 standard drinks    Comment: occ  . Drug use: No     Allergies   Bee venom; Hctz [hydrochlorothiazide]; and Lozol [indapamide]   Review of Systems Review of Systems  All other systems reviewed and are negative.    Physical Exam Updated Vital Signs BP 127/76 (BP Location: Right Arm)   Pulse 78   Temp 98.3 F (36.8 C) (Oral)   Resp (!) 22   Wt 122.5 kg   SpO2 94%   BMI 36.62 kg/m   Physical Exam  Nursing note and vitals reviewed.  62 year old male, resting comfortably and in no acute distress. Vital signs are significant for mildly elevated respiratory rate. Oxygen saturation is 94%, which is normal. Head is normocephalic and atraumatic. PERRLA, EOMI. Oropharynx is clear. Neck is nontender and supple without adenopathy or JVD. Back is nontender and there is no CVA tenderness. Lungs are clear without rales, wheezes, or rhonchi. Chest is nontender. Heart has regular rate and rhythm without murmur. Abdomen is soft, flat, nontender without masses or hepatosplenomegaly and peristalsis is normoactive. Extremities: Mild swelling and erythema of the left olecranon with fluctuance present.  Left forearm is swollen and mildly erythematous without any fluctuance or crepitus.  Left forearm circumference is approximately 1.5 cm greater than right forearm circumference.  No palpable axillary or epitrochlear nodes, no lymphangitic streaks seen.  Remainder of extremity exam is unremarkable. Skin is warm and dry without rash. Neurologic: Mental status is normal, cranial nerves are intact, there are no motor or sensory deficits.  ED Treatments / Results  Labs (all labs ordered are listed, but only abnormal results are  displayed) Labs Reviewed - No data to display  Procedures [place] bedside Date/Time: 09/05/2018 4:25 AM Performed by: Delora Fuel, MD Authorized by: Delora Fuel, MD  Consent: Verbal consent obtained. Written consent not obtained. Risks and benefits: risks, benefits and alternatives were discussed Patient understanding: patient states understanding of the procedure being performed Patient consent: the patient's understanding of the procedure matches consent given Procedure consent: procedure consent matches procedure scheduled Relevant documents: relevant documents present and verified Test results: test results available and properly labeled Site marked: the operative site was marked Required items: required blood products, implants, devices, and special equipment available Patient identity confirmed: verbally with patient and arm band Time out: Immediately prior to procedure a "time out" was called to verify the correct patient, procedure, equipment, support staff and site/side  marked as required. Local anesthesia used: no  Anesthesia: Local anesthesia used: no  Sedation: Patient sedated: no  Patient tolerance: Patient tolerated the procedure well with no immediate complications Comments: Limited bedside ultrasound done to evaluate left olecranon bursa and forearm.  Evidence of fluid in the left olecranon bursa was identified.  No fluid collection seen in the left forearm, but cobblestone appearance consistent with cellulitis is noted.  Images were archived electronically.  Marland Kitchen.Incision and Drainage Date/Time: 09/05/2018 5:50 AM Performed by: Delora Fuel, MD Authorized by: Delora Fuel, MD   Consent:    Consent obtained:  Verbal   Consent given by:  Patient   Risks discussed:  Bleeding, incomplete drainage and pain   Alternatives discussed:  No treatment Location:    Type:  Fluid collection   Location:  Upper extremity   Upper extremity location:  Elbow   Elbow location:  L  elbow (Olecrenon bursa) Pre-procedure details:    Skin preparation:  Chloraprep Anesthesia (see MAR for exact dosages):    Anesthesia method:  None Procedure type:    Complexity:  Simple Procedure details:    Needle aspiration: yes     Needle size:  18 G   Drainage:  Serosanguinous   Drainage amount:  Moderate (12 ml)   Packing materials:  None Post-procedure details:    Patient tolerance of procedure:  Tolerated well, no immediate complications   Medications Ordered in ED Medications  cephALEXin (KEFLEX) capsule 1,000 mg (has no administration in time range)  lidocaine (XYLOCAINE) 2 % (with pres) injection 100 mg (100 mg Infiltration Given 09/05/18 0512)     Initial Impression / Assessment and Plan / ED Course  I have reviewed the triage vital signs and the nursing notes.  Pertinent labs & imaging results that were available during my care of the patient were reviewed by me and considered in my medical decision making (see chart for details).  Left olecranon bursitis complicated by a cellulitis of the left forearm.  Patient is afebrile and nontoxic in appearance.  Will aspirate his olecranon bursa and he will need to be started on antibiotics.  Will check CBC and sedimentation rate.  Anticipate he should be able to start with oral antibiotics.  Old records are reviewed, and he has no relevant past visits.  Left olecranon bursa is aspirated with 18-gauge needle yielding 12 mL of serosanguineous fluid.  No purulent material identified.  WBC is normal with normal differential, sedimentation rate pending at this point.  He is given a dose of cephalexin.  Sedimentation rate is only minimally elevated.  He was felt to be safe for home management and is discharged with prescription for cephalexin.  He is to follow-up with his orthopedic surgeon in 2 days for recheck.  Return precautions discussed.  Final Clinical Impressions(s) / ED Diagnoses   Final diagnoses:  Effusion of olecranon  bursa, left  Cellulitis of left forearm    ED Discharge Orders         Ordered    cephALEXin (KEFLEX) 500 MG capsule  4 times daily     09/05/18 8299           Delora Fuel, MD 37/16/96 [phone]

## 2018-09-06 ENCOUNTER — Emergency Department (HOSPITAL_COMMUNITY): Payer: 59

## 2018-09-06 ENCOUNTER — Encounter (HOSPITAL_COMMUNITY): Payer: Self-pay | Admitting: Emergency Medicine

## 2018-09-06 ENCOUNTER — Emergency Department (HOSPITAL_COMMUNITY)
Admission: EM | Admit: 2018-09-06 | Discharge: 2018-09-06 | Disposition: A | Discharge status: Home / Self Care | Payer: 59 | Attending: Emergency Medicine | Admitting: Emergency Medicine

## 2018-09-06 DIAGNOSIS — Y939 Activity, unspecified: Secondary | ICD-10-CM | POA: Insufficient documentation

## 2018-09-06 DIAGNOSIS — J449 Chronic obstructive pulmonary disease, unspecified: Secondary | ICD-10-CM | POA: Insufficient documentation

## 2018-09-06 DIAGNOSIS — M7022 Olecranon bursitis, left elbow: Secondary | ICD-10-CM | POA: Insufficient documentation

## 2018-09-06 DIAGNOSIS — Z79899 Other long term (current) drug therapy: Secondary | ICD-10-CM | POA: Insufficient documentation

## 2018-09-06 DIAGNOSIS — Z7982 Long term (current) use of aspirin: Secondary | ICD-10-CM | POA: Insufficient documentation

## 2018-09-06 DIAGNOSIS — I10 Essential (primary) hypertension: Secondary | ICD-10-CM | POA: Insufficient documentation

## 2018-09-06 DIAGNOSIS — M7989 Other specified soft tissue disorders: Secondary | ICD-10-CM | POA: Diagnosis not present

## 2018-09-06 DIAGNOSIS — Z87891 Personal history of nicotine dependence: Secondary | ICD-10-CM | POA: Insufficient documentation

## 2018-09-06 DIAGNOSIS — M25422 Effusion, left elbow: Secondary | ICD-10-CM | POA: Diagnosis not present

## 2018-09-06 DIAGNOSIS — R2232 Localized swelling, mass and lump, left upper limb: Secondary | ICD-10-CM | POA: Diagnosis not present

## 2018-09-06 LAB — CBC WITH DIFFERENTIAL/PLATELET
Basophils Absolute: 0 10*3/uL (ref 0.0–0.1)
Basophils Relative: 0 %
EOS PCT: 2 %
Eosinophils Absolute: 0.1 10*3/uL (ref 0.0–0.7)
HEMATOCRIT: 45.6 % (ref 39.0–52.0)
Hemoglobin: 15.2 g/dL (ref 13.0–17.0)
LYMPHS PCT: 15 %
Lymphs Abs: 1.3 10*3/uL (ref 0.7–4.0)
MCH: 32.1 pg (ref 26.0–34.0)
MCHC: 33.3 g/dL (ref 30.0–36.0)
MCV: 96.4 fL (ref 78.0–100.0)
MONO ABS: 0.7 10*3/uL (ref 0.1–1.0)
MONOS PCT: 8 %
NEUTROS ABS: 6.7 10*3/uL (ref 1.7–7.7)
Neutrophils Relative %: 75 %
PLATELETS: 200 10*3/uL (ref 150–400)
RBC: 4.73 MIL/uL (ref 4.22–5.81)
RDW: 13.2 % (ref 11.5–15.5)
WBC: 8.9 10*3/uL (ref 4.0–10.5)

## 2018-09-06 NOTE — ED Provider Notes (Signed)
[facility] EMERGENCY DEPARTMENT Provider Note   CSN: [id number] Arrival date & time: 09/06/18  1158     History   Chief Complaint Chief Complaint  Patient presents with  . Follow-up    HPI TRENT GABLER is a 62 y.o. male.  HPI Patient was seen 2 days ago for left olecranon bursitis and cellulitis.  Had I&D performed and patient was placed on antibiotics.  Patient states that the bursa though still swollen is less red and tender.  He does complain of increased diffuse left upper extremity swelling.  No fever or chills.  He is continue to take antibiotics as prescribed. Past Medical History:  Diagnosis Date  . Allergic rhinitis   . Anxiety   . COPD (chronic obstructive pulmonary disease) ([place])   . Depression   . ED (erectile dysfunction)   . Finger laceration involving tendon    right ring finger flexor tendon  . Hypertension   . Insomnia   . PNA (pneumonia)   . Reactive airways dysfunction syndrome ([place])   . Stress     Patient Active Problem List   Diagnosis Date Noted  . Venous stasis 09/24/2015  . COPD exacerbation ([place]) 08/27/2015  . Essential hypertension, benign 03/09/2013  . Allergic rhinitis 03/09/2013  . Generalized anxiety disorder 03/09/2013    Past Surgical History:  Procedure Laterality Date  . FLEXOR TENDON REPAIR Right 09/07/2017   Procedure: RIGHT RING FINGER FLEXOR TENDON REPAIR;  Surgeon: Milly Jakob, MD;  Location: [facility];  Service: Orthopedics;  Laterality: Right;  . pylondial cyst    . ROTATOR CUFF REPAIR          Home Medications    Prior to Admission medications   Medication Sig Start Date End Date Taking? Authorizing Provider  albuterol (PROAIR HFA) 108 (90 Base) MCG/ACT inhaler USE 2 PUFFS EVERY 6 HOURS AS NEEDED FOR WHEEZING. Patient taking differently: Inhale 2 puffs into the lungs every 6 (six) hours as needed for wheezing.  08/19/17  Yes Mikey Kirschner, MD  ALPRAZolam Duanne Moron) 0.5 MG tablet Take 0.5-1  tablets (0.25-0.5 mg total) by mouth at bedtime as needed. Patient taking differently: Take 0.25-0.5 mg by mouth at bedtime as needed for anxiety.  03/22/18  Yes Mikey Kirschner, MD  amLODipine-benazepril (LOTREL) 5-20 MG capsule TAKE 1 CAPSULE ONCE DAILY Patient taking differently: Take 1 capsule by mouth daily.  04/19/18  Yes Mikey Kirschner, MD  aspirin 81 MG tablet Take 81 mg by mouth daily.   Yes [provider]  buPROPion (WELLBUTRIN SR) 150 MG 12 hr tablet TAKE 1 TABLET TWICE A DAY Patient taking differently: Take 300 mg by mouth daily.  04/19/18  Yes Mikey Kirschner, MD  cephALEXin (KEFLEX) 500 MG capsule Take 1 capsule (500 mg total) by mouth 4 (four) times daily. 05/21/36  Yes Delora Fuel, MD  fluticasone-salmeterol (ADVAIR HFA) [phone] MCG/ACT inhaler Inhale 2 puffs into the lungs 2 (two) times daily. 03/22/18  Yes Mikey Kirschner, MD  furosemide (LASIX) 20 MG tablet Take 1 tablet (20 mg total) by mouth daily. 09/06/18  Yes Mikey Kirschner, MD  potassium chloride SA (K-DUR,KLOR-CON) 20 MEQ tablet Take 1 tablet (20 mEq total) by mouth every morning. Patient taking differently: Take 20 mEq by mouth daily.  03/22/18  Yes Mikey Kirschner, MD    Family History Family History  Problem Relation Age of Onset  . Heart disease Mother   . Cancer Father  Social History Social History   Tobacco Use  . Smoking status: Former Smoker    Packs/day: 1.00    Types: Cigarettes  . Smokeless tobacco: Never Used  Substance Use Topics  . Alcohol use: Yes    Alcohol/week: 0.0 standard drinks    Comment: occ  . Drug use: No     Allergies   Bee venom; Hctz [hydrochlorothiazide]; and Lozol [indapamide]   Review of Systems Review of Systems  Constitutional: Negative for chills and fever.  Respiratory: Negative for cough and shortness of breath.   Cardiovascular: Negative for chest pain.  Musculoskeletal: Positive for joint swelling and myalgias. Negative for arthralgias, neck  pain and neck stiffness.  Skin: Positive for color change and wound.  Neurological: Negative for dizziness, weakness, numbness and headaches.  All other systems reviewed and are negative.    Physical Exam Updated Vital Signs BP 132/74 (BP Location: Right Arm)   Pulse 71   Temp 98 F (36.7 C)   Resp 20   SpO2 96%   Physical Exam  Constitutional: He is oriented to person, place, and time. He appears well-developed and well-nourished.  HENT:  Head: Normocephalic and atraumatic.  Eyes: Pupils are equal, round, and reactive to light. EOM are normal.  Neck: Normal range of motion. Neck supple.  Cardiovascular: Normal rate and regular rhythm.  Pulmonary/Chest: Effort normal and breath sounds normal.  Abdominal: Soft. Bowel sounds are normal.  Musculoskeletal: Normal range of motion. He exhibits no edema or tenderness.  Patient with full range of motion of the left elbow.  No obvious elbow effusion.  Patient does have swelling to the left olecranon bursa with very minimal erythema.  Distal pulses intact.  Mildly increased swelling to the left forearm compared to the right.  No crepitance or deformity.  Neurological: He is alert and oriented to person, place, and time.  Sensation fully intact.  5/5 motor in all extremities.  Skin: Skin is warm and dry. No rash noted. No erythema.  Psychiatric: He has a normal mood and affect. His behavior is normal.  Nursing note and vitals reviewed.    ED Treatments / Results  Labs (all labs ordered are listed, but only abnormal results are displayed) Labs Reviewed  CBC WITH DIFFERENTIAL/PLATELET    EKG None  Radiology US Venous Img Upper Uni Left  Result Date: 09/06/2018 CLINICAL DATA:  Draining fluid from left elbow EXAM: LEFT UPPER EXTREMITY VENOUS DOPPLER ULTRASOUND TECHNIQUE: Gray-scale sonography with graded compression, as well as color Doppler and duplex ultrasound were performed to evaluate the upper extremity deep venous system from  the level of the subclavian vein and including the jugular, axillary, basilic, radial, ulnar and upper cephalic vein. Spectral Doppler was utilized to evaluate flow at rest and with distal augmentation maneuvers. COMPARISON:  None. FINDINGS: Contralateral Subclavian Vein: Respiratory phasicity is normal and symmetric with the symptomatic side. No evidence of thrombus. Normal compressibility. Internal Jugular Vein: No evidence of thrombus. Normal compressibility, respiratory phasicity and response to augmentation. Subclavian Vein: No evidence of thrombus. Normal compressibility, respiratory phasicity and response to augmentation. Axillary Vein: No evidence of thrombus. Normal compressibility, respiratory phasicity and response to augmentation. Cephalic Vein: No evidence of thrombus. Normal compressibility, respiratory phasicity and response to augmentation. Basilic Vein: No evidence of thrombus. Normal compressibility, respiratory phasicity and response to augmentation. Brachial Veins: No evidence of thrombus. Normal compressibility, respiratory phasicity and response to augmentation. Radial Veins: No evidence of thrombus. Normal compressibility, respiratory phasicity and response to augmentation. Ulnar  Veins: No evidence of thrombus. Normal compressibility, respiratory phasicity and response to augmentation. Venous Reflux:  None visualized. Other Findings:  None visualized. IMPRESSION: No evidence of DVT within the left upper extremity. Electronically Signed   By: Inez Catalina M.D.   On: 09/06/2018 16:19    Procedures Procedures (including critical care time)  Medications Ordered in ED Medications - No data to display   Initial Impression / Assessment and Plan / ED Course  I have reviewed the triage vital signs and the nursing notes.  Pertinent labs & imaging results that were available during my care of the patient were reviewed by me and considered in my medical decision making (see chart for  details).     Bursitis appears to be improving.  Patient does have some increased swelling to the left forearm.  Ultrasound without evidence of DVT.  Suspect likely due to decreased movement.  Advised to keep arm elevated above the heart.  Continue antibiotics.  Return precautions given.  Final Clinical Impressions(s) / ED Diagnoses   Final diagnoses:  Olecranon bursitis of left elbow  Left arm swelling    ED Discharge Orders    None       Julianne Rice, MD 09/06/18 1734

## 2018-09-06 NOTE — Discharge Instructions (Addendum)
Continue antibiotics.  Keep left arm elevated above heart.  Continue taking antibiotics as prescribed.  Return in 2 days if symptoms are not improving.

## 2018-09-06 NOTE — ED Triage Notes (Signed)
Pt reports recent drainage of left elbow.  States the redness is better, but swelling and pain continue.

## 2018-09-20 ENCOUNTER — Ambulatory Visit: Payer: 59 | Admitting: Family Medicine

## 2018-09-20 ENCOUNTER — Encounter: Payer: Self-pay | Admitting: Family Medicine

## 2018-09-20 VITALS — BP 132/86 | Ht 72.0 in | Wt 271.2 lb

## 2018-09-20 DIAGNOSIS — J449 Chronic obstructive pulmonary disease, unspecified: Secondary | ICD-10-CM

## 2018-09-20 DIAGNOSIS — J4540 Moderate persistent asthma, uncomplicated: Secondary | ICD-10-CM

## 2018-09-20 DIAGNOSIS — F411 Generalized anxiety disorder: Secondary | ICD-10-CM

## 2018-09-20 DIAGNOSIS — I10 Essential (primary) hypertension: Secondary | ICD-10-CM | POA: Diagnosis not present

## 2018-09-20 DIAGNOSIS — F5101 Primary insomnia: Secondary | ICD-10-CM

## 2018-09-20 MED ORDER — AMLODIPINE BESY-BENAZEPRIL HCL 5-20 MG PO CAPS: 1.0000 | ORAL_CAPSULE | Freq: Every day | ORAL | 5 refills | Status: DC

## 2018-09-20 MED ORDER — POTASSIUM CHLORIDE CRYS ER 20 MEQ PO TBCR: 20.0000 meq | EXTENDED_RELEASE_TABLET | Freq: Every morning | ORAL | 5 refills | Status: DC

## 2018-09-20 MED ORDER — FLUTICASONE-SALMETEROL 115-21 MCG/ACT IN AERO: 2.0000 | INHALATION_SPRAY | Freq: Two times a day (BID) | RESPIRATORY_TRACT | 5 refills | Status: DC

## 2018-09-20 MED ORDER — FUROSEMIDE 20 MG PO TABS: 20.0000 mg | ORAL_TABLET | Freq: Every day | ORAL | 5 refills | Status: DC

## 2018-09-20 MED ORDER — ALPRAZOLAM 0.5 MG PO TABS: 0.2500 mg | ORAL_TABLET | Freq: Every evening | ORAL | 5 refills | Status: DC | PRN

## 2018-09-20 MED ORDER — BUPROPION HCL ER (SR) 150 MG PO TB12: 150.0000 mg | ORAL_TABLET | Freq: Two times a day (BID) | ORAL | 5 refills | Status: DC

## 2018-09-20 NOTE — Progress Notes (Signed)
   Subjective:    Patient ID: Ronald Juarez, male    DOB: [birth year], 62 y.o.   MRN: [id number]  Hypertension  This is a chronic problem. The current episode started more than 1 year ago. Risk factors for coronary artery disease include male gender. Treatments tried: lotrel, lasix. There are no compliance problems.    Patient went to ER twice in last several weeks for arm infection/swelling but is doing better.  Patient notes residual left elbow swelling.  Full reports reviewed today in presence of patient   Blood pressure medicine and blood pressure levels reviewed today with patient. Compliant with blood pressure medicine. States does not miss a dose. No obvious side effects. Blood pressure generally good when checked elsewhere. Watching salt intake.  Asthma overall stable, no major challenge with wheezing. Rare use of rescue inhaler,  overall anxiety and stress are doing well. Compliant with meds, some short tempered ness but not serious   Notes some wheezing when overheated  Patient notes his chronic anxiety is stable as long as he sticks with his medication.   Numbers overall good at home and lswewhere   Review of Systems No headache, no major weight loss or weight gain, no chest pain no back pain abdominal pain no change in bowel habits complete ROS otherwise negative     Objective:   Physical Exam  Alert and oriented, vitals reviewed and stable, NAD ENT-TM's and ext canals WNL bilat via otoscopic exam Soft palate, tonsils and post pharynx WNL via oropharyngeal exam Neck-symmetric, no masses; thyroid nonpalpable and nontender Pulmonary-no tachypnea or accessory muscle use; Clear without wheezes via auscultation Card--no abnrml murmurs, rhythm reg and rate WNL Carotid pulses symmetric, without bruits Left elbow residual or [place] puffiness.  Postinflammatory skin changes      Assessment & Plan:  htn.  Overall stable.  Medications well.  No obvious side effects  patient to maintain same therapy.  anx anxiety.  Ongoing.  Discussed.  Utilizing PRN Xanax and Wellbutrin  ast asthma overall stable.  As long as he uses Advair faithfully still using a lot  Olecranon bursitis disc at length, cst  Greater than 50% of this 25 minute face to face visit was spent in counseling and discussion and coordination of care regarding the above diagnosis/diagnosies

## 2018-10-22 ENCOUNTER — Other Ambulatory Visit: Payer: Self-pay | Admitting: Family Medicine

## 2018-10-23 ENCOUNTER — Other Ambulatory Visit: Payer: Self-pay | Admitting: Family Medicine

## 2018-11-13 ENCOUNTER — Other Ambulatory Visit: Payer: Self-pay | Admitting: Family Medicine

## 2018-12-10 ENCOUNTER — Other Ambulatory Visit: Payer: Self-pay | Admitting: Family Medicine

## 2019-01-08 ENCOUNTER — Other Ambulatory Visit: Payer: Self-pay | Admitting: Family Medicine

## 2019-02-15 ENCOUNTER — Telehealth: Payer: Self-pay | Admitting: Family Medicine

## 2019-02-15 NOTE — Telephone Encounter (Signed)
Message For Dr.Steve and Staff--Patient wanted to say thank you for all you done for his wife Tammy. All you done down through the years taking care of them. He states he had seen where you called but has been so busy getting everything in order. He wanted to personally thank you and the staff here for all your kindness.

## 2019-02-15 NOTE — Telephone Encounter (Signed)
Thank you :)

## 2019-02-24 ENCOUNTER — Other Ambulatory Visit: Payer: Self-pay | Admitting: Family Medicine

## 2019-03-24 ENCOUNTER — Encounter: Payer: 59 | Admitting: Family Medicine

## 2019-04-26 ENCOUNTER — Other Ambulatory Visit: Payer: Self-pay | Admitting: Family Medicine

## 2019-04-26 NOTE — Telephone Encounter (Signed)
Virtual visit plz sched, then may fil times one pending

## 2019-04-26 NOTE — Telephone Encounter (Signed)
Contacted patient. Pt states that he has picked up this med and not sure why pharmacy sending request again. Pt states he would like to know when he could come in for a physical. Pt states he is starting a new insurance and a nurse came out yesterday and check BP and it was 118/73. Pt would like to be called back in the morning before lunchtime due to working 2nd shift. Please advise. Thank you

## 2019-04-26 NOTE — Telephone Encounter (Signed)
sched p e plus [facility] early June face to face.

## 2019-05-11 ENCOUNTER — Other Ambulatory Visit: Payer: Self-pay | Admitting: Family Medicine

## 2019-05-18 ENCOUNTER — Encounter: Payer: Self-pay | Admitting: Family Medicine

## 2019-05-18 ENCOUNTER — Ambulatory Visit (INDEPENDENT_AMBULATORY_CARE_PROVIDER_SITE_OTHER): Payer: 59 | Admitting: Family Medicine

## 2019-05-18 ENCOUNTER — Other Ambulatory Visit: Payer: Self-pay

## 2019-05-18 VITALS — BP 128/82 | Temp 97.2°F | Wt 276.4 lb

## 2019-05-18 DIAGNOSIS — J4540 Moderate persistent asthma, uncomplicated: Secondary | ICD-10-CM

## 2019-05-18 DIAGNOSIS — Z0000 Encounter for general adult medical examination without abnormal findings: Secondary | ICD-10-CM | POA: Diagnosis not present

## 2019-05-18 DIAGNOSIS — Z13220 Encounter for screening for lipoid disorders: Secondary | ICD-10-CM | POA: Diagnosis not present

## 2019-05-18 DIAGNOSIS — I10 Essential (primary) hypertension: Secondary | ICD-10-CM

## 2019-05-18 DIAGNOSIS — Z79899 Other long term (current) drug therapy: Secondary | ICD-10-CM | POA: Diagnosis not present

## 2019-05-18 DIAGNOSIS — Z1211 Encounter for screening for malignant neoplasm of colon: Secondary | ICD-10-CM | POA: Diagnosis not present

## 2019-05-18 DIAGNOSIS — Z125 Encounter for screening for malignant neoplasm of prostate: Secondary | ICD-10-CM

## 2019-05-18 DIAGNOSIS — F411 Generalized anxiety disorder: Secondary | ICD-10-CM

## 2019-05-18 DIAGNOSIS — F5101 Primary insomnia: Secondary | ICD-10-CM

## 2019-05-18 MED ORDER — AMLODIPINE BESY-BENAZEPRIL HCL 5-20 MG PO CAPS: 1.0000 | ORAL_CAPSULE | Freq: Every day | ORAL | 0 refills | Status: DC

## 2019-05-18 MED ORDER — ALPRAZOLAM 0.5 MG PO TABS: 0.2500 mg | ORAL_TABLET | Freq: Every evening | ORAL | 5 refills | Status: DC | PRN

## 2019-05-18 MED ORDER — BUPROPION HCL ER (SR) 150 MG PO TB12: 150.0000 mg | ORAL_TABLET | Freq: Two times a day (BID) | ORAL | 5 refills | Status: DC

## 2019-05-18 MED ORDER — FLUTICASONE-SALMETEROL 115-21 MCG/ACT IN AERO: 2.0000 | INHALATION_SPRAY | Freq: Two times a day (BID) | RESPIRATORY_TRACT | 5 refills | Status: DC

## 2019-05-18 MED ORDER — POTASSIUM CHLORIDE CRYS ER 20 MEQ PO TBCR: 20.0000 meq | EXTENDED_RELEASE_TABLET | Freq: Every day | ORAL | 5 refills | Status: DC

## 2019-05-18 MED ORDER — FUROSEMIDE 20 MG PO TABS: ORAL_TABLET | ORAL | 5 refills | Status: DC

## 2019-05-18 NOTE — Progress Notes (Signed)
Subjective:    Patient ID: Ronald Juarez, male    DOB: 03-Jul-1956, 63 y.o.   MRN: [id number] Patient arrives for wellness visit plus chronic concerns HPI  The patient comes in today for a wellness visit.    A review of their health history was completed.  A review of medications was also completed.  Any needed refills; yes  Eating habits: good  Falls/  MVA accidents in past few months: none  Regular exercise: work  Sales promotion account executive pt sees on regular basis: none  Preventative health issues were discussed.   Additional concerns: not sleeping well- misses his wife  Blood pressure medicine and blood pressure levels reviewed today with patient. Compliant with blood pressure medicine. States does not miss a dose. No obvious side effects. Blood pressure generally good when checked elsewhere. Watching salt intake.   Anxiety ongoing  stil sig.  Compliant with Wellbutrin.  No major side effects.  Some flare understandably with recent loss of his wife  Takes xanax two to three times per wk   .inom  Patient compliant with insomnia medication. Generally takes most nights. No obvious morning drowsiness. Definitely helps patient sleep. Without it patient states would not get a good nights rest.  asthma, generally stable, compliant with medications  Still going through quite a bit of stress with the loss of his wife from cancer earlier  Still thinking about colonoscopy  Review of Systems  Constitutional: Negative for activity change, appetite change and fever.  HENT: Negative for congestion and rhinorrhea.   Eyes: Negative for discharge.  Respiratory: Negative for cough and wheezing.   Cardiovascular: Negative for chest pain.  Gastrointestinal: Negative for abdominal pain, blood in stool and vomiting.  Genitourinary: Negative for difficulty urinating and frequency.  Musculoskeletal: Negative for neck pain.  Skin: Negative for rash.  Allergic/Immunologic: Negative for environmental  allergies and food allergies.  Neurological: Negative for weakness and headaches.  Psychiatric/Behavioral: Negative for agitation.  All other systems reviewed and are negative.      Objective:   Physical Exam Vitals signs reviewed.  Constitutional:      Appearance: He is well-developed.  HENT:     Head: Normocephalic and atraumatic.     Right Ear: External ear normal.     Left Ear: External ear normal.     Nose: Nose normal.  Eyes:     Pupils: Pupils are equal, round, and reactive to light.  Neck:     Musculoskeletal: Normal range of motion and neck supple.     Thyroid: No thyromegaly.  Cardiovascular:     Rate and Rhythm: Normal rate and regular rhythm.     Heart sounds: Normal heart sounds. No murmur.  Pulmonary:     Effort: Pulmonary effort is normal. No respiratory distress.     Breath sounds: Normal breath sounds. No wheezing.  Abdominal:     General: Bowel sounds are normal. There is no distension.     Palpations: Abdomen is soft. There is no mass.     Tenderness: There is no abdominal tenderness.  Genitourinary:    Penis: Normal.   Musculoskeletal: Normal range of motion.  Lymphadenopathy:     Cervical: No cervical adenopathy.  Skin:    General: Skin is warm and dry.     Findings: No erythema.  Neurological:     Mental Status: He is alert.     Motor: No abnormal muscle tone.  Psychiatric:        Behavior: Behavior normal.  Judgment: Judgment normal.   Prostate exam within normal limits        Assessment & Plan:  Impression 1 wellness exam.  Screening blood work today.  Diet exercise discussed.  Patient promises will schedule colonoscopy  2.  Hypertension good control discussed maintain same meds  3.  Asthma moderate persistent.  Compliant with meds no major challenges  4.  Insomnia ongoing using PRN Xanax  5.  Generalized anxiety disorder.  Clinically stable  6.  Venous stasis worsening per patient would like the ability to take a couple of  Lasix at times during flare seems to help we will adjust medicine  All chronic meds refilled.  Diet exercise discussed.  Colonoscopy scheduled.  Appropriate blood work follow-up in 6 months

## 2019-05-19 LAB — HEPATIC FUNCTION PANEL
ALT: 42 IU/L (ref 0–44)
AST: 31 IU/L (ref 0–40)
Albumin: 4.3 g/dL (ref 3.8–4.8)
Alkaline Phosphatase: 62 IU/L (ref 39–117)
Bilirubin Total: 0.5 mg/dL (ref 0.0–1.2)
Bilirubin, Direct: 0.12 mg/dL (ref 0.00–0.40)
Total Protein: 6.9 g/dL (ref 6.0–8.5)

## 2019-05-19 LAB — LIPID PANEL
Chol/HDL Ratio: 3.7 ratio (ref 0.0–5.0)
Cholesterol, Total: 170 mg/dL (ref 100–199)
HDL: 46 mg/dL (ref >39)
LDL Calculated: 96 mg/dL (ref 0–99)
Triglycerides: 140 mg/dL (ref 0–149)
VLDL Cholesterol Cal: 28 mg/dL (ref 5–40)

## 2019-05-19 LAB — PSA: Prostate Specific Ag, Serum: 0.8 ng/mL (ref 0.0–4.0)

## 2019-05-19 LAB — BASIC METABOLIC PANEL
BUN/Creatinine Ratio: 27 — ABNORMAL HIGH (ref 10–24)
BUN: 21 mg/dL (ref 8–27)
CO2: 24 mmol/L (ref 20–29)
Calcium: 8.9 mg/dL (ref 8.6–10.2)
Chloride: 102 mmol/L (ref 96–106)
Creatinine, Ser: 0.77 mg/dL (ref 0.76–1.27)
GFR calc Af Amer: 112 mL/min/{1.73_m2} (ref >59)
GFR calc non Af Amer: 97 mL/min/{1.73_m2} (ref >59)
Glucose: 101 mg/dL — ABNORMAL HIGH (ref 65–99)
Potassium: 4.9 mmol/L (ref 3.5–5.2)
Sodium: 141 mmol/L (ref 134–144)

## 2019-05-22 ENCOUNTER — Encounter: Payer: Self-pay | Admitting: Family Medicine

## 2019-05-30 ENCOUNTER — Encounter: Payer: Self-pay | Admitting: Family Medicine

## 2019-05-31 DIAGNOSIS — Z029 Encounter for administrative examinations, unspecified: Secondary | ICD-10-CM

## 2019-06-01 ENCOUNTER — Encounter: Payer: Self-pay | Admitting: Internal Medicine

## 2019-06-06 ENCOUNTER — Other Ambulatory Visit: Payer: Self-pay | Admitting: Family Medicine

## 2019-06-28 ENCOUNTER — Other Ambulatory Visit: Payer: Self-pay | Admitting: Family Medicine

## 2019-07-06 ENCOUNTER — Other Ambulatory Visit: Payer: Self-pay | Admitting: Family Medicine

## 2019-08-16 ENCOUNTER — Encounter: Payer: Self-pay | Admitting: Gastroenterology

## 2019-08-16 ENCOUNTER — Other Ambulatory Visit: Payer: Self-pay

## 2019-08-16 ENCOUNTER — Ambulatory Visit (INDEPENDENT_AMBULATORY_CARE_PROVIDER_SITE_OTHER): Payer: 59 | Admitting: Gastroenterology

## 2019-08-16 DIAGNOSIS — Z800 Family history of malignant neoplasm of digestive organs: Secondary | ICD-10-CM

## 2019-08-16 DIAGNOSIS — Z1211 Encounter for screening for malignant neoplasm of colon: Secondary | ICD-10-CM

## 2019-08-16 MED ORDER — SUPREP BOWEL PREP KIT 17.5-3.13-1.6 GM/177ML PO SOLN: 1.0000 | ORAL | 0 refills | Status: DC

## 2019-08-16 NOTE — Patient Instructions (Signed)
Colonoscopy as scheduled. See separate instructions.  

## 2019-08-16 NOTE — Progress Notes (Signed)
Primary Care Physician:  Mikey Kirschner, MD  Primary Gastroenterologist:  Garfield Cornea, MD   Chief Complaint  Patient presents with  . Consult    TCS. last TCS atleast 90yrs or more ago. brother-hx colon cancer    HPI:  Ronald Juarez is a 63 y.o. male here to schedule colonoscopy at the request of Dr Wolfgang Phoenix.  Patient's last colonoscopy was in 2008.  He had left-sided diverticula.  Advised to have repeat colonoscopy in 5 years due to family history of colon cancer, half brother at age 21.  From a GI standpoint he has been feeling well.  Bowel movements 2-3 times per day.  No melena or rectal bleeding.  No abdominal pain.  No upper GI symptoms.  No unintentional weight loss.  He lost his wife in February after battle with cancer.  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    Current Outpatient Medications  Medication Sig Dispense Refill  . albuterol (VENTOLIN HFA) 108 (90 Base) MCG/ACT inhaler Inhale 2 puffs into the lungs every 6 (six) hours as needed for wheezing. 18 g 3  . ALPRAZolam (XANAX) 0.5 MG tablet Take 0.5-1 tablets (0.25-0.5 mg total) by mouth at bedtime as needed. 30 tablet 5  . amLODipine-benazepril (LOTREL) 5-20 MG capsule TAKE 1 CAPSULE BY MOUTH DAILY 30 capsule 5  . buPROPion (WELLBUTRIN SR) 150 MG 12 hr tablet Take 1 tablet (150 mg total) by mouth 2 (two) times daily. 60 tablet 5  . fluticasone-salmeterol (ADVAIR HFA) 115-21 MCG/ACT inhaler Inhale 2 puffs into the lungs 2 (two) times daily. (Patient taking differently: Inhale 2 puffs into the lungs 2 (two) times daily as needed. ) 1 Inhaler 5  . furosemide (LASIX) 20 MG tablet TAKE 1 TABLET(20 MG) BY MOUTH DAILY-MAY TAKE 2 A DAY TWICE A WEEK 40 tablet 5  . potassium chloride SA (K-DUR) 20 MEQ tablet Take 1 tablet (20 mEq total) by mouth daily. 30 tablet 5  . sildenafil (VIAGRA) 100 MG tablet TAKE 1 TABLET BY MOUTH 1 HOUR PRIOR TO INTERCOURSE 10 tablet 0  . valACYclovir (VALTREX) 500 MG tablet TAKE 1 TABLET BY MOUTH  TWICE DAILY (Patient taking differently: as needed. ) 6 tablet 6   No current facility-administered medications for this visit.    Facility-Administered Medications Ordered in Other Visits  Medication Dose Route Frequency Provider Last Rate Last Dose  . fentaNYL (SUBLIMAZE) injection 25-50 mcg  25-50 mcg Intravenous Q5 min PRN Myrtie Soman, MD      . promethazine (PHENERGAN) injection 6.25-12.5 mg  6.25-12.5 mg Intravenous Q15 min PRN Myrtie Soman, MD        Allergies as of 08/16/2019 - Review Complete 08/16/2019  Allergen Reaction Noted  . Bee venom  03/13/2014  . Hctz [hydrochlorothiazide]  02/23/2013  . Lozol [indapamide]  02/23/2013    Past Medical History:  Diagnosis Date  . Allergic rhinitis   . Anxiety   . COPD (chronic obstructive pulmonary disease) ([place])   . Depression   . ED (erectile dysfunction)   . Finger laceration involving tendon    right ring finger flexor tendon  . Hypertension   . Insomnia   . PNA (pneumonia)   . Reactive airways dysfunction syndrome ([place])   . Stress     Past Surgical History:  Procedure Laterality Date  . COLONOSCOPY  2008   Dr. Gala Romney: left sided diverticula. recommend five year follow up due to [facility]  . FLEXOR TENDON REPAIR Right 09/07/2017   Procedure: RIGHT  RING FINGER FLEXOR TENDON REPAIR;  Surgeon: Milly Jakob, MD;  Location: [facility];  Service: Orthopedics;  Laterality: Right;  . pylondial cyst    . ROTATOR CUFF REPAIR      Family History  Problem Relation Age of Onset  . Colon cancer Brother 50       agent orange  . Heart disease Mother   . Cancer Father     Social History   Socioeconomic History  . Marital status: Married    Spouse name: Not on file  . Number of children: Not on file  . Years of education: Not on file  . Highest education level: Not on file  Occupational History  . Not on file  Social Needs  . Financial resource strain: Not on file  . Food insecurity    Worry: Not on file     Inability: Not on file  . Transportation needs    Medical: Not on file    Non-medical: Not on file  Tobacco Use  . Smoking status: Former Smoker    Packs/day: 1.00    Types: Cigarettes  . Smokeless tobacco: Never Used  Substance and Sexual Activity  . Alcohol use: Yes    Alcohol/week: 0.0 standard drinks    Comment: occ  . Drug use: No  . Sexual activity: Not on file  Lifestyle  . Physical activity    Days per week: Not on file    Minutes per session: Not on file  . Stress: Not on file  Relationships  . Social Herbalist on phone: Not on file    Gets together: Not on file    Attends religious service: Not on file    Active member of club or organization: Not on file    Attends meetings of clubs or organizations: Not on file    Relationship status: Not on file  . Intimate partner violence    Fear of current or ex partner: Not on file    Emotionally abused: Not on file    Physically abused: Not on file    Forced sexual activity: Not on file  Other Topics Concern  . Not on file  Social History Narrative  . Not on file      ROS:  General: Negative for anorexia, weight loss, fever, chills, fatigue, weakness. Eyes: Negative for vision changes.  ENT: Negative for hoarseness, difficulty swallowing , nasal congestion. CV: Negative for chest pain, angina, palpitations, dyspnea on exertion, peripheral edema.  Respiratory: Negative for dyspnea at rest, dyspnea on exertion, cough, sputum, wheezing.  GI: See history of present illness. GU:  Negative for dysuria, hematuria, urinary incontinence, urinary frequency, nocturnal urination.  MS: Negative for joint pain, low back pain.  Derm: Negative for rash or itching.  Neuro: Negative for weakness, abnormal sensation, seizure, frequent headaches, memory loss, confusion.  Psych: Some anxiety. no depression, suicidal ideation, hallucinations.  Endo: Negative for unusual weight change.  Heme: Negative for bruising or  bleeding. Allergy: Negative for rash or hives.    Physical Examination:  BP 128/77   Pulse 74   Temp (!) 97.3 F (36.3 C) (Oral)   Ht 6\' 1"  (1.854 m)   Wt 285 lb 12.8 oz (129.6 kg)   BMI 37.71 kg/m    General: Well-nourished, well-developed in no acute distress.  Head: Normocephalic, atraumatic.   Eyes: Conjunctiva pink, no icterus. Mouth: Oropharyngeal mucosa moist and pink , no lesions erythema or exudate. Neck: Supple without thyromegaly,  masses, or lymphadenopathy.  Lungs: Clear to auscultation bilaterally.  Heart: Regular rate and rhythm, no murmurs rubs or gallops.  Abdomen: Bowel sounds are normal, nontender, nondistended, no hepatosplenomegaly or masses, no abdominal bruits or    hernia , no rebound or guarding.   Rectal: Not performed Extremities: No lower extremity edema. No clubbing or deformities.  Neuro: Alert and oriented x 4 , grossly normal neurologically.  Skin: Warm and dry, no rash or jaundice.   Psych: Alert and cooperative, normal mood and affect.  Labs: Lab Results  Component Value Date   CREATININE 0.77 05/18/2019   BUN 21 05/18/2019   NA 141 05/18/2019   K 4.9 05/18/2019   CL 102 05/18/2019   CO2 24 05/18/2019   Lab Results  Component Value Date   ALT 42 05/18/2019   AST 31 05/18/2019   ALKPHOS 62 05/18/2019   BILITOT 0.5 05/18/2019   Lab Results  Component Value Date   WBC 8.9 09/06/2018   HGB 15.2 09/06/2018   HCT 45.6 09/06/2018   MCV 96.4 09/06/2018   PLT 200 09/06/2018     Imaging Studies: No results found.

## 2019-08-16 NOTE — Assessment & Plan Note (Signed)
63 year old gentleman with family history of colon cancer, half brother before the age of 29.  Patient's last colonoscopy 12 years ago.  Overdue for high risk reading colonoscopy.  Denies any GI symptoms.  Plan for deep sedation given polypharmacy.  I have discussed the risks, alternatives, benefits with regards to but not limited to the risk of reaction to medication, bleeding, infection, perforation and the patient is agreeable to proceed. Written consent to be obtained.

## 2019-08-23 NOTE — Patient Instructions (Signed)
PA for TCS submitted via [facility] website. Case approved. PA# [id number], valid 10/24/19-01/22/20.

## 2019-10-19 NOTE — Patient Instructions (Signed)
Ronald Juarez  10/19/2019     @PREFPERIOPPHARMACY @   Your procedure is scheduled on  10/24/2019  Report to [facility] at  [email or web address].M.  Call this number if you have problems the morning of surgery:  [phone]   Remember:  Follow the diet and prep instructions given to you by Dr Roseanne Kaufman office.   Take these medicines the morning of surgery with A SIP OF WATER  Amlodipine, wellbutrin.    Do not wear jewelry, make-up or nail polish.  Do not wear lotions, powders, or perfume. Please wear deodorant and brush your teeth.  Do not shave 48 hours prior to surgery.  Men may shave face and neck.  Do not bring valuables to the hospital.  [facility] is not responsible for any belongings or valuables.  Contacts, dentures or bridgework may not be worn into surgery.  Leave your suitcase in the car.  After surgery it may be brought to your room.  For patients admitted to the hospital, discharge time will be determined by your treatment team.  Patients discharged the day of surgery will not be allowed to drive home.   Name and phone number of your driver:   family Special instructions:  None  Please read over the following fact sheets that you were given. Anesthesia Post-op Instructions and Care and Recovery After Surgery       Colonoscopy, Adult, Care After This sheet gives you information about how to care for yourself after your procedure. Your health care provider may also give you more specific instructions. If you have problems or questions, contact your health care provider. What can I expect after the procedure? After the procedure, it is common to have:  A small amount of blood in your stool for 24 hours after the procedure.  Some gas.  Mild abdominal cramping or bloating. Follow these instructions at home: General instructions  For the first 24 hours after the procedure: ? Do not drive or use machinery. ? Do not sign important documents. ? Do not drink  alcohol. ? Do your regular daily activities at a slower pace than normal. ? Eat soft, easy-to-digest foods.  Take over-the-counter or prescription medicines only as told by your health care provider. Relieving cramping and bloating   Try walking around when you have cramps or feel bloated.  Apply heat to your abdomen as told by your health care provider. Use a heat source that your health care provider recommends, such as a moist heat pack or a heating pad. ? Place a towel between your skin and the heat source. ? Leave the heat on for 20-30 minutes. ? Remove the heat if your skin turns bright red. This is especially important if you are unable to feel pain, heat, or cold. You may have a greater risk of getting burned. Eating and drinking   Drink enough fluid to keep your urine pale yellow.  Resume your normal diet as instructed by your health care provider. Avoid heavy or fried foods that are hard to digest.  Avoid drinking alcohol for as long as instructed by your health care provider. Contact a health care provider if:  You have blood in your stool 2-3 days after the procedure. Get help right away if:  You have more than a small spotting of blood in your stool.  You pass large blood clots in your stool.  Your abdomen is swollen.  You have nausea or vomiting.  You have  a fever.  You have increasing abdominal pain that is not relieved with medicine. Summary  After the procedure, it is common to have a small amount of blood in your stool. You may also have mild abdominal cramping and bloating.  For the first 24 hours after the procedure, do not drive or use machinery, sign important documents, or drink alcohol.  Contact your health care provider if you have a lot of blood in your stool, nausea or vomiting, a fever, or increased abdominal pain. This information is not intended to replace advice given to you by your health care provider. Make sure you discuss any questions  you have with your health care provider. Document Released: 07/15/2004 Document Revised: 09/23/2017 Document Reviewed: 02/12/2016 Elsevier Patient Education  2020 [place] After These instructions provide you with information about caring for yourself after your procedure. Your health care provider may also give you more specific instructions. Your treatment has been planned according to current medical practices, but problems sometimes occur. Call your health care provider if you have any problems or questions after your procedure. What can I expect after the procedure? After your procedure, you may:  Feel sleepy for several hours.  Feel clumsy and have poor balance for several hours.  Feel forgetful about what happened after the procedure.  Have poor judgment for several hours.  Feel nauseous or vomit.  Have a sore throat if you had a breathing tube during the procedure. Follow these instructions at home: For at least 24 hours after the procedure:      Have a responsible adult stay with you. It is important to have someone help care for you until you are awake and alert.  Rest as needed.  Do not: ? Participate in activities in which you could fall or become injured. ? Drive. ? Use heavy machinery. ? Drink alcohol. ? Take sleeping pills or medicines that cause drowsiness. ? Make important decisions or sign legal documents. ? Take care of children on your own. Eating and drinking  Follow the diet that is recommended by your health care provider.  If you vomit, drink water, juice, or soup when you can drink without vomiting.  Make sure you have little or no nausea before eating solid foods. General instructions  Take over-the-counter and prescription medicines only as told by your health care provider.  If you have sleep apnea, surgery and certain medicines can increase your risk for breathing problems. Follow instructions from your  health care provider about wearing your sleep device: ? Anytime you are sleeping, including during daytime naps. ? While taking prescription pain medicines, sleeping medicines, or medicines that make you drowsy.  If you smoke, do not smoke without supervision.  Keep all follow-up visits as told by your health care provider. This is important. Contact a health care provider if:  You keep feeling nauseous or you keep vomiting.  You feel light-headed.  You develop a rash.  You have a fever. Get help right away if:  You have trouble breathing. Summary  For several hours after your procedure, you may feel sleepy and have poor judgment.  Have a responsible adult stay with you for at least 24 hours or until you are awake and alert. This information is not intended to replace advice given to you by your health care provider. Make sure you discuss any questions you have with your health care provider. Document Released: 03/23/2016 Document Revised: 03/01/2018 Document Reviewed: 03/23/2016 Elsevier Patient Education  2020 Elsevier Inc.  

## 2019-10-20 ENCOUNTER — Other Ambulatory Visit: Payer: Self-pay

## 2019-10-20 ENCOUNTER — Encounter (HOSPITAL_COMMUNITY)
Admission: RE | Admit: 2019-10-20 | Discharge: 2019-10-20 | Disposition: A | Discharge status: Home / Self Care | Payer: 59 | Source: Ambulatory Visit | Attending: Internal Medicine | Admitting: Internal Medicine

## 2019-10-20 ENCOUNTER — Encounter (HOSPITAL_COMMUNITY): Payer: Self-pay

## 2019-10-20 ENCOUNTER — Other Ambulatory Visit (HOSPITAL_COMMUNITY)
Admission: RE | Admit: 2019-10-20 | Discharge: 2019-10-20 | Disposition: A | Discharge status: Home / Self Care | Payer: 59 | Source: Ambulatory Visit | Attending: Internal Medicine | Admitting: Internal Medicine

## 2019-10-20 DIAGNOSIS — Z20828 Contact with and (suspected) exposure to other viral communicable diseases: Secondary | ICD-10-CM | POA: Insufficient documentation

## 2019-10-20 DIAGNOSIS — Z01812 Encounter for preprocedural laboratory examination: Secondary | ICD-10-CM | POA: Diagnosis present

## 2019-10-20 LAB — BASIC METABOLIC PANEL
Anion gap: 10 (ref 5–15)
BUN: 18 mg/dL (ref 8–23)
CO2: 26 mmol/L (ref 22–32)
Calcium: 8.9 mg/dL (ref 8.9–10.3)
Chloride: 101 mmol/L (ref 98–111)
Creatinine, Ser: 0.83 mg/dL (ref 0.61–1.24)
GFR calc Af Amer: >60 mL/min (ref >60)
GFR calc non Af Amer: >60 mL/min (ref >60)
Glucose, Bld: 101 mg/dL — ABNORMAL HIGH (ref 70–99)
Potassium: 4.2 mmol/L (ref 3.5–5.1)
Sodium: 137 mmol/L (ref 135–145)

## 2019-10-20 LAB — SARS CORONAVIRUS 2 (TAT 6-24 HRS): SARS Coronavirus 2: NEGATIVE

## 2019-10-20 NOTE — Progress Notes (Signed)
   10/20/19 1302  OBSTRUCTIVE SLEEP APNEA  Have you ever been diagnosed with sleep apnea through a sleep study? No  Do you snore loudly (loud enough to be heard through closed doors)?  0  Do you often feel tired, fatigued, or sleepy during the daytime (such as falling asleep during driving or talking to someone)? 0  Has anyone observed you stop breathing during your sleep? 0  Do you have, or are you being treated for high blood pressure? 1  BMI more than 35 kg/m2? 1  Age > 50 (1-yes) 1  Neck circumference greater than:Male 16 inches or larger, Male 17inches or larger? 1  Male Gender (Yes=1) 1  Obstructive Sleep Apnea Score 5  Score 5 or greater  Results sent to PCP

## 2019-10-24 ENCOUNTER — Encounter (HOSPITAL_COMMUNITY)
Admission: RE | Disposition: A | Discharge status: Home / Self Care | Payer: Self-pay | Source: Ambulatory Visit | Attending: Internal Medicine

## 2019-10-24 ENCOUNTER — Ambulatory Visit (HOSPITAL_COMMUNITY): Payer: 59 | Admitting: Anesthesiology

## 2019-10-24 ENCOUNTER — Encounter (HOSPITAL_COMMUNITY): Payer: Self-pay

## 2019-10-24 ENCOUNTER — Other Ambulatory Visit: Payer: Self-pay

## 2019-10-24 ENCOUNTER — Ambulatory Visit (HOSPITAL_COMMUNITY)
Admission: RE | Admit: 2019-10-24 | Discharge: 2019-10-24 | Disposition: A | Discharge status: Home / Self Care | Payer: 59 | Source: Ambulatory Visit | Attending: Internal Medicine | Admitting: Internal Medicine

## 2019-10-24 DIAGNOSIS — D120 Benign neoplasm of cecum: Secondary | ICD-10-CM | POA: Insufficient documentation

## 2019-10-24 DIAGNOSIS — K635 Polyp of colon: Secondary | ICD-10-CM

## 2019-10-24 DIAGNOSIS — F419 Anxiety disorder, unspecified: Secondary | ICD-10-CM | POA: Insufficient documentation

## 2019-10-24 DIAGNOSIS — Z800 Family history of malignant neoplasm of digestive organs: Secondary | ICD-10-CM

## 2019-10-24 DIAGNOSIS — Z87891 Personal history of nicotine dependence: Secondary | ICD-10-CM | POA: Diagnosis not present

## 2019-10-24 DIAGNOSIS — I10 Essential (primary) hypertension: Secondary | ICD-10-CM | POA: Diagnosis not present

## 2019-10-24 DIAGNOSIS — Z1211 Encounter for screening for malignant neoplasm of colon: Secondary | ICD-10-CM | POA: Diagnosis not present

## 2019-10-24 DIAGNOSIS — K640 First degree hemorrhoids: Secondary | ICD-10-CM | POA: Insufficient documentation

## 2019-10-24 DIAGNOSIS — J449 Chronic obstructive pulmonary disease, unspecified: Secondary | ICD-10-CM | POA: Insufficient documentation

## 2019-10-24 DIAGNOSIS — Z79899 Other long term (current) drug therapy: Secondary | ICD-10-CM | POA: Diagnosis not present

## 2019-10-24 DIAGNOSIS — F329 Major depressive disorder, single episode, unspecified: Secondary | ICD-10-CM | POA: Insufficient documentation

## 2019-10-24 DIAGNOSIS — K5730 Diverticulosis of large intestine without perforation or abscess without bleeding: Secondary | ICD-10-CM | POA: Insufficient documentation

## 2019-10-24 DIAGNOSIS — N529 Male erectile dysfunction, unspecified: Secondary | ICD-10-CM | POA: Diagnosis not present

## 2019-10-24 HISTORY — PX: BIOPSY: SHX5522

## 2019-10-24 HISTORY — PX: COLONOSCOPY WITH PROPOFOL: SHX5780

## 2019-10-24 SURGERY — COLONOSCOPY WITH PROPOFOL
Anesthesia: General

## 2019-10-24 MED ORDER — MIDAZOLAM HCL 2 MG/2ML IJ SOLN: 0.5000 mg | Freq: Once | INTRAMUSCULAR | Status: DC | PRN

## 2019-10-24 MED ORDER — LACTATED RINGERS IV SOLN
INTRAVENOUS | Status: DC | PRN
  Administered 2019-10-24: 09:00:00 via INTRAVENOUS

## 2019-10-24 MED ORDER — CHLORHEXIDINE GLUCONATE CLOTH 2 % EX PADS: 6.0000 | MEDICATED_PAD | Freq: Once | CUTANEOUS | Status: DC

## 2019-10-24 MED ORDER — LACTATED RINGERS IV SOLN: INTRAVENOUS | Status: DC

## 2019-10-24 MED ORDER — KETAMINE HCL 10 MG/ML IJ SOLN
INTRAMUSCULAR | Status: DC | PRN
  Administered 2019-10-24 (×2): 10 mg via INTRAVENOUS

## 2019-10-24 MED ORDER — PROPOFOL 500 MG/50ML IV EMUL
INTRAVENOUS | Status: DC | PRN
  Administered 2019-10-24: 200 ug/kg/min via INTRAVENOUS

## 2019-10-24 MED ORDER — PROPOFOL 10 MG/ML IV BOLUS
INTRAVENOUS | Status: DC | PRN
  Administered 2019-10-24 (×2): 20 mg via INTRAVENOUS

## 2019-10-24 MED ORDER — KETAMINE HCL 50 MG/5ML IJ SOSY
PREFILLED_SYRINGE | INTRAMUSCULAR | Status: AC
  Filled 2019-10-24: qty 5

## 2019-10-24 MED ORDER — HYDROMORPHONE HCL 1 MG/ML IJ SOLN: 0.2500 mg | INTRAMUSCULAR | Status: DC | PRN

## 2019-10-24 MED ORDER — HYDROCODONE-ACETAMINOPHEN 7.5-325 MG PO TABS: 1.0000 | ORAL_TABLET | Freq: Once | ORAL | Status: DC | PRN

## 2019-10-24 NOTE — Anesthesia Postprocedure Evaluation (Signed)
Anesthesia Post Note  Patient: Ronald Juarez  Procedure(s) Performed: COLONOSCOPY WITH PROPOFOL (N/A ) BIOPSY  Patient location during evaluation: PACU Anesthesia Type: General Level of consciousness: awake and alert and patient cooperative Pain management: satisfactory to patient Vital Signs Assessment: post-procedure vital signs reviewed and stable Respiratory status: spontaneous breathing Cardiovascular status: stable Postop Assessment: no apparent nausea or vomiting Anesthetic complications: no     Last Vitals:  Vitals:   10/24/19 0835 10/24/19 1003  BP: 140/88 126/80  Pulse: 72 76  Resp: 18 12  Temp:  (P) 36.6 C  SpO2: 96% 98%    Last Pain:  Vitals:   10/24/19 0835  TempSrc: Oral  PainSc: 0-No pain                 Cortny Bambach

## 2019-10-24 NOTE — Transfer of Care (Signed)
Immediate Anesthesia Transfer of Care Note  Patient: Ronald Juarez  Procedure(s) Performed: COLONOSCOPY WITH PROPOFOL (N/A ) BIOPSY  Patient Location: PACU  Anesthesia Type:General  Level of Consciousness: awake, alert  and patient cooperative  Airway & Oxygen Therapy: Patient Spontanous Breathing  Post-op Assessment: Report given to RN and Post -op Vital signs reviewed and stable  Post vital signs: Reviewed and stable  Last Vitals:  Vitals Value Taken Time  BP 126/80   Temp 97.8   Pulse 76 10/24/19 1004  Resp 12 10/24/19 1004  SpO2 98 % 10/24/19 1004  Vitals shown include unvalidated device data.  Last Pain:  Vitals:   10/24/19 0835  TempSrc: Oral  PainSc: 0-No pain         Complications: No apparent anesthesia complications

## 2019-10-24 NOTE — Op Note (Signed)
[facility] Patient Name: Ronald Juarez Procedure Date: 10/24/2019 9:19 AM MRN: [id number] Date of Birth: 07-04-1956 Attending MD: Norvel Richards , MD CSN: [id number] Age: 63 Admit Type: Outpatient Procedure:                Colonoscopy Indications:              Screening in patient at increased risk: Family                            history of 1st-degree relative with colorectal                            cancer before age 61 years Providers:                Norvel Richards, MD, Janeece Riggers, RN, Aram Candela Referring MD:              Medicines:                Propofol per Anesthesia Complications:            No immediate complications. Estimated Blood Loss:     Estimated blood loss was minimal. Procedure:                Pre-Anesthesia Assessment:                           - Prior to the procedure, a History and Physical                            was performed, and patient medications and                            allergies were reviewed. The patient's tolerance of                            previous anesthesia was also reviewed. The risks                            and benefits of the procedure and the sedation                            options and risks were discussed with the patient.                            All questions were answered, and informed consent                            was obtained. Prior Anticoagulants: The patient has                            taken no previous anticoagulant or antiplatelet  agents. ASA Grade Assessment: II - A patient with                            mild systemic disease. After reviewing the risks                            and benefits, the patient was deemed in                            satisfactory condition to undergo the procedure.                           After obtaining informed consent, the colonoscope                            was passed under direct vision.  Throughout the                            procedure, the patient's blood pressure, pulse, and                            oxygen saturations were monitored continuously. The                            [id number] scope was introduced through                            the anus and advanced to the the cecum, identified                            by appendiceal orifice and ileocecal valve. The                            colonoscopy was performed without difficulty. The                            patient tolerated the procedure well. The quality                            of the bowel preparation was adequate. Scope In: 9:48:05 AM Scope Out: 9:57:52 AM Scope Withdrawal Time: 0 hours 7 minutes 31 seconds  Total Procedure Duration: 0 hours 9 minutes 47 seconds  Findings:      The perianal and digital rectal examinations were normal.      A few small-mouthed diverticula were found in the sigmoid colon and       descending colon.      A 3 mm polyp was found in the cecum. The polyp was sessile. The polyp       was removed with a cold biopsy forceps. Resection and retrieval were       complete. Estimated blood loss was minimal.      Internal hemorrhoids were found during retroflexion. The hemorrhoids       were mild, small and Grade I (internal hemorrhoids that do not prolapse).      The exam  was otherwise without abnormality on direct and retroflexion       views. Impression:               - Diverticulosis in the sigmoid colon and in the                            descending colon.                           - One 3 mm polyp in the cecum, removed with a cold                            biopsy forceps. Resected and retrieved.                           - Internal hemorrhoids.                           - The examination was otherwise normal on direct                            and retroflexion views. Moderate Sedation:      Moderate (conscious) sedation was personally administered by an        anesthesia professional. The following parameters were monitored: oxygen       saturation, heart rate, blood pressure, respiratory rate, EKG, adequacy       of pulmonary ventilation, and response to care. Recommendation:           - Patient has a contact number available for                            emergencies. The signs and symptoms of potential                            delayed complications were discussed with the                            patient. Return to normal activities tomorrow.                            Written discharge instructions were provided to the                            patient.                           - Resume previous diet.                           - Continue present medications.                           - Repeat colonoscopy date to be determined after                            pending pathology results are reviewed for  surveillance.                           - Return to GI office (date not yet determined). Procedure Code(s):        --- Professional ---                           [phone], Colonoscopy, flexible; with biopsy, single                            or multiple Diagnosis Code(s):        --- Professional ---                           Z80.0, Family history of malignant neoplasm of                            digestive organs                           K64.0, First degree hemorrhoids                           K63.5, Polyp of colon                           K57.30, Diverticulosis of large intestine without                            perforation or abscess without bleeding CPT copyright 2019 American Medical Association. All rights reserved. The codes documented in this report are preliminary and upon coder review may  be revised to meet current compliance requirements. Cristopher Estimable. Basha Krygier, MD Norvel Richards, MD 10/24/2019 10:06:03 AM This report has been signed electronically. Number of Addenda: 0

## 2019-10-24 NOTE — Discharge Instructions (Signed)
Colonoscopy Discharge Instructions  Read the instructions outlined below and refer to this sheet in the next few weeks. These discharge instructions provide you with general information on caring for yourself after you leave the hospital. Your doctor may also give you specific instructions. While your treatment has been planned according to the most current medical practices available, unavoidable complications occasionally occur. If you have any problems or questions after discharge, call Dr. Gala Romney at [phone]. ACTIVITY  You may resume your regular activity, but move at a slower pace for the next 24 hours.   Take frequent rest periods for the next 24 hours.   Walking will help get rid of the air and reduce the bloated feeling in your belly (abdomen).   No driving for 24 hours (because of the medicine (anesthesia) used during the test).    Do not sign any important legal documents or operate any machinery for 24 hours (because of the anesthesia used during the test).  NUTRITION  Drink plenty of fluids.   You may resume your normal diet as instructed by your doctor.   Begin with a light meal and progress to your normal diet. Heavy or fried foods are harder to digest and may make you feel sick to your stomach (nauseated).   Avoid alcoholic beverages for 24 hours or as instructed.  MEDICATIONS  You may resume your normal medications unless your doctor tells you otherwise.  WHAT YOU CAN EXPECT TODAY  Some feelings of bloating in the abdomen.   Passage of more gas than usual.   Spotting of blood in your stool or on the toilet paper.  IF YOU HAD POLYPS REMOVED DURING THE COLONOSCOPY:  No aspirin products for 7 days or as instructed.   No alcohol for 7 days or as instructed.   Eat a soft diet for the next 24 hours.  FINDING OUT THE RESULTS OF YOUR TEST Not all test results are available during your visit. If your test results are not back during the visit, make an appointment  with your caregiver to find out the results. Do not assume everything is normal if you have not heard from your caregiver or the medical facility. It is important for you to follow up on all of your test results.  SEEK IMMEDIATE MEDICAL ATTENTION IF:  You have more than a spotting of blood in your stool.   Your belly is swollen (abdominal distention).   You are nauseated or vomiting.   You have a temperature over 101.   You have abdominal pain or discomfort that is severe or gets worse throughout the day.    Diverticulosis  Diverticulosis is a condition that develops when small pouches (diverticula) form in the wall of the large intestine (colon). The colon is where water is absorbed and stool is formed. The pouches form when the inside layer of the colon pushes through weak spots in the outer layers of the colon. You may have a few pouches or many of them. What are the causes? The cause of this condition is not known. What increases the risk? The following factors may make you more likely to develop this condition:  Being older than age 63. Your risk for this condition increases with age. Diverticulosis is rare among people younger than age 61. By age 53, many people have it.  Eating a low-fiber diet.  Having frequent constipation.  Being overweight.  Not getting enough exercise.  Smoking.  Taking over-the-counter pain medicines, like aspirin and ibuprofen.  Having a family history of diverticulosis. What are the signs or symptoms? In most people, there are no symptoms of this condition. If you do have symptoms, they may include:  Bloating.  Cramps in the abdomen.  Constipation or diarrhea.  Pain in the lower left side of the abdomen. How is this diagnosed? This condition is most often diagnosed during an exam for other colon problems. Because diverticulosis usually has no symptoms, it often cannot be diagnosed independently. This condition may be diagnosed  by:  Using a flexible scope to examine the colon (colonoscopy).  Taking an X-ray of the colon after dye has been put into the colon (barium enema).  Doing a CT scan. How is this treated? You may not need treatment for this condition if you have never developed an infection related to diverticulosis. If you have had an infection before, treatment may include:  Eating a high-fiber diet. This may include eating more fruits, vegetables, and grains.  Taking a fiber supplement.  Taking a live bacteria supplement (probiotic).  Taking medicine to relax your colon.  Taking antibiotic medicines. Follow these instructions at home:  Drink 6-8 glasses of water or more each day to prevent constipation.  Try not to strain when you have a bowel movement.  If you have had an infection before: ? Eat more fiber as directed by your health care provider or your diet and nutrition specialist (dietitian). ? Take a fiber supplement or probiotic, if your health care provider approves.  Take over-the-counter and prescription medicines only as told by your health care provider.  If you were prescribed an antibiotic, take it as told by your health care provider. Do not stop taking the antibiotic even if you start to feel better.  Keep all follow-up visits as told by your health care provider. This is important. Contact a health care provider if:  You have pain in your abdomen.  You have bloating.  You have cramps.  You have not had a bowel movement in 3 days. Get help right away if:  Your pain gets worse.  Your bloating becomes very bad.  You have a fever or chills, and your symptoms suddenly get worse.  You vomit.  You have bowel movements that are bloody or black.  You have bleeding from your rectum. Summary  Diverticulosis is a condition that develops when small pouches (diverticula) form in the wall of the large intestine (colon).  You may have a few pouches or many of  them.  This condition is most often diagnosed during an exam for other colon problems.  If you have had an infection related to diverticulosis, treatment may include increasing the fiber in your diet, taking supplements, or taking medicines. This information is not intended to replace advice given to you by your health care provider. Make sure you discuss any questions you have with your health care provider. Document Released: 08/28/2004 Document Revised: 11/13/2017 Document Reviewed: 10/20/2016 Elsevier Patient Education  [place].  Colon Polyps  Polyps are tissue growths inside the body. Polyps can grow in many places, including the large intestine (colon). A polyp may be a round bump or a mushroom-shaped growth. You could have one polyp or several. Most colon polyps are noncancerous (benign). However, some colon polyps can become cancerous over time. Finding and removing the polyps early can help prevent this. What are the causes? The exact cause of colon polyps is not known. What increases the risk? You are more likely to develop this  condition if you:  Have a family history of colon cancer or colon polyps.  Are older than 33 or older than 45 if you are African American.  Have inflammatory bowel disease, such as ulcerative colitis or Crohn's disease.  Have certain hereditary conditions, such as: ? Familial adenomatous polyposis. ? Lynch syndrome. ? Turcot syndrome. ? Peutz-Jeghers syndrome.  Are overweight.  Smoke cigarettes.  Do not get enough exercise.  Drink too much alcohol.  Eat a diet that is high in fat and red meat and low in fiber.  Had childhood cancer that was treated with abdominal radiation. What are the signs or symptoms? Most polyps do not cause symptoms. If you have symptoms, they may include:  Blood coming from your rectum when having a bowel movement.  Blood in your stool. The stool may look dark red or black.  Abdominal pain.  A  change in bowel habits, such as constipation or diarrhea. How is this diagnosed? This condition is diagnosed with a colonoscopy. This is a procedure in which a lighted, flexible scope is inserted into the anus and then passed into the colon to examine the area. Polyps are sometimes found when a colonoscopy is done as part of routine cancer screening tests. How is this treated? Treatment for this condition involves removing any polyps that are found. Most polyps can be removed during a colonoscopy. Those polyps will then be tested for cancer. Additional treatment may be needed depending on the results of testing. Follow these instructions at home: Lifestyle  Maintain a healthy weight, or lose weight if recommended by your health care provider.  Exercise every day or as told by your health care provider.  Do not use any products that contain nicotine or tobacco, such as cigarettes and e-cigarettes. If you need help quitting, ask your health care provider.  If you drink alcohol, limit how much you have: ? 0-1 drink a day for women. ? 0-2 drinks a day for men.  Be aware of how much alcohol is in your drink. In the [place] one drink equals one 12 oz bottle of beer (355 mL), one 5 oz glass of wine (148 mL), or one 1 oz shot of hard liquor (44 mL). Eating and drinking   Eat foods that are high in fiber, such as fruits, vegetables, and whole grains.  Eat foods that are high in calcium and vitamin D, such as milk, cheese, yogurt, eggs, liver, fish, and broccoli.  Limit foods that are high in fat, such as fried foods and desserts.  Limit the amount of red meat and processed meat you eat, such as hot dogs, sausage, bacon, and lunch meats. General instructions  Keep all follow-up visits as told by your health care provider. This is important. ? This includes having regularly scheduled colonoscopies. ? Talk to your health care provider about when you need a colonoscopy. Contact a health care  provider if:  You have new or worsening bleeding during a bowel movement.  You have new or increased blood in your stool.  You have a change in bowel habits.  You lose weight for no known reason. Summary  Polyps are tissue growths inside the body. Polyps can grow in many places, including the colon.  Most colon polyps are noncancerous (benign), but some can become cancerous over time.  This condition is diagnosed with a colonoscopy.  Treatment for this condition involves removing any polyps that are found. Most polyps can be removed during a colonoscopy. This information is  not intended to replace advice given to you by your health care provider. Make sure you discuss any questions you have with your health care provider. Document Released: 08/27/2004 Document Revised: 03/18/2018 Document Reviewed: 03/18/2018 Elsevier Patient Education  [place].   Colon polyp and diverticulosis information provided  Further recommendations to follow pending review of pathology report  At patient request I called Elta Guadeloupe, brother at [phone]; got generic voicemail.  Did not leave a message.

## 2019-10-24 NOTE — H&P (Signed)
_0 @   Primary Care Physician:  Mikey Kirschner, MD Primary Gastroenterologist:  Dr. Gala Romney  Pre-Procedure History & Physical: HPI:  Ronald Juarez is a 63 y.o. male here for for high risk screening colonoscopy last exam 2008 (diverticulosis);   positive family history of colon cancer in a first-degree relative.  No GI symptoms currently.  Past Medical History:  Diagnosis Date  . Allergic rhinitis   . Anxiety   . COPD (chronic obstructive pulmonary disease) ([place])   . Depression   . ED (erectile dysfunction)   . Finger laceration involving tendon    right ring finger flexor tendon  . Hypertension   . Insomnia   . PNA (pneumonia)   . Reactive airways dysfunction syndrome ([place])   . Stress     Past Surgical History:  Procedure Laterality Date  . COLONOSCOPY  2008   Dr. Gala Romney: left sided diverticula. recommend five year follow up due to [facility]  . FLEXOR TENDON REPAIR Right 09/07/2017   Procedure: RIGHT RING FINGER FLEXOR TENDON REPAIR;  Surgeon: Milly Jakob, MD;  Location: [facility];  Service: Orthopedics;  Laterality: Right;  . pylondial cyst    . ROTATOR CUFF REPAIR      Prior to Admission medications   Medication Sig Start Date End Date Taking? Authorizing Provider  albuterol (VENTOLIN HFA) 108 (90 Base) MCG/ACT inhaler Inhale 2 puffs into the lungs every 6 (six) hours as needed for wheezing. 01/10/19  Yes Mikey Kirschner, MD  ALPRAZolam Duanne Moron) 0.5 MG tablet Take 0.5-1 tablets (0.25-0.5 mg total) by mouth at bedtime as needed. 05/18/19  Yes Mikey Kirschner, MD  amLODipine-benazepril (LOTREL) 5-20 MG capsule TAKE 1 CAPSULE BY MOUTH DAILY 06/28/19  Yes Mikey Kirschner, MD  buPROPion [facility] SR) 150 MG 12 hr tablet Take 1 tablet (150 mg total) by mouth 2 (two) times daily. 05/18/19  Yes Mikey Kirschner, MD  fluticasone-salmeterol (ADVAIR HFA) [phone] MCG/ACT inhaler Inhale 2 puffs into the lungs 2 (two) times daily. Patient taking differently: Inhale 2  puffs into the lungs 2 (two) times daily as needed.  05/18/19  Yes Mikey Kirschner, MD  furosemide (LASIX) 20 MG tablet TAKE 1 TABLET(20 MG) BY MOUTH DAILY-MAY TAKE 2 A DAY TWICE A WEEK 05/18/19  Yes Mikey Kirschner, MD  Na Sulfate-K Sulfate-Mg Sulf (SUPREP BOWEL PREP KIT) 17.5-3.13-1.6 GM/177ML SOLN Take 1 kit by mouth as directed. 08/16/19  Yes Anani Gu, Cristopher Estimable, MD  potassium chloride SA (K-DUR) 20 MEQ tablet Take 1 tablet (20 mEq total) by mouth daily. 05/18/19  Yes Mikey Kirschner, MD  sildenafil (VIAGRA) 100 MG tablet TAKE 1 TABLET BY MOUTH 1 HOUR PRIOR TO INTERCOURSE 07/06/19   Mikey Kirschner, MD  valACYclovir (VALTREX) 500 MG tablet TAKE 1 TABLET BY MOUTH TWICE DAILY Patient taking differently: as needed.  02/24/19   Mikey Kirschner, MD    Allergies as of 08/16/2019 - Review Complete 08/16/2019  Allergen Reaction Noted  . Bee venom  03/13/2014  . Hctz [hydrochlorothiazide]  02/23/2013  . Lozol [indapamide]  02/23/2013    Family History  Problem Relation Age of Onset  . Colon cancer Brother 50       agent orange  . Heart disease Mother   . Cancer Father     Social History   Socioeconomic History  . Marital status: Married    Spouse name: Not on file  . Number of children: Not on file  . Years of  education: Not on file  . Highest education level: Not on file  Occupational History  . Not on file  Social Needs  . Financial resource strain: Not on file  . Food insecurity    Worry: Not on file    Inability: Not on file  . Transportation needs    Medical: Not on file    Non-medical: Not on file  Tobacco Use  . Smoking status: Former Smoker    Packs/day: 1.00    Types: Cigarettes    Quit date: 10/20/2011    Years since quitting: 8.0  . Smokeless tobacco: Never Used  Substance and Sexual Activity  . Alcohol use: Yes    Alcohol/week: 0.0 standard drinks    Comment: occ  . Drug use: No  . Sexual activity: Not on file  Lifestyle  . Physical activity    Days per  week: Not on file    Minutes per session: Not on file  . Stress: Not on file  Relationships  . Social Herbalist on phone: Not on file    Gets together: Not on file    Attends religious service: Not on file    Active member of club or organization: Not on file    Attends meetings of clubs or organizations: Not on file    Relationship status: Not on file  . Intimate partner violence    Fear of current or ex partner: Not on file    Emotionally abused: Not on file    Physically abused: Not on file    Forced sexual activity: Not on file  Other Topics Concern  . Not on file  Social History Narrative  . Not on file    Review of Systems: See HPI, otherwise negative ROS  Physical Exam: BP 140/88   Pulse 72   Resp 18   Ht 6' 1.5" (1.867 m)   Wt 123.8 kg   SpO2 96%   BMI 35.53 kg/m  General:   Alert,  Well-developed, well-nourished, pleasant and cooperative in NAD Neck:  Supple; no masses or thyromegaly. No significant cervical adenopathy. Lungs:  Clear throughout to auscultation.   No wheezes, crackles, or rhonchi. No acute distress. Heart:  Regular rate and rhythm; no murmurs, clicks, rubs,  or gallops. Abdomen: Non-distended, normal bowel sounds.  Soft and nontender without appreciable mass or hepatosplenomegaly.  Pulses:  Normal pulses noted. Extremities:  Without clubbing or edema.  Impression/Plan: 63 year old gentleman with a positive family history colon cancer his brother at a young age.  Overdue for high rescreening colonoscopy.  I have offered the patient a high rescreening colonoscopy today.  The risks, benefits, limitations, alternatives and imponderables have been reviewed with the patient. Questions have been answered. All parties are agreeable.    Notice: This dictation was prepared with Dragon dictation along with smaller phrase technology. Any transcriptional errors that result from this process are unintentional and may not be corrected upon  review.

## 2019-10-24 NOTE — Anesthesia Preprocedure Evaluation (Signed)
Anesthesia Evaluation  Patient identified by MRN, date of birth, ID band Patient awake    Reviewed: Allergy & Precautions, NPO status , Patient's Chart, lab work & pertinent test results  Airway Mallampati: II  TM Distance: >3 FB Neck ROM: Full    Dental no notable dental hx. (+) Loose,  L lower middle tooth loose  R upper front tooth chipped :   Pulmonary asthma , pneumonia, resolved, COPD,  COPD inhaler, former smoker,    Pulmonary exam normal breath sounds clear to auscultation       Cardiovascular Exercise Tolerance: Good hypertension, + DOE  Normal cardiovascular examI Rhythm:Regular Rate:Normal  Denies CP/MI States walks 3-7miles/day for work    Neuro/Psych Anxiety Depression negative neurological ROS  negative psych ROS   GI/Hepatic negative GI ROS, Neg liver ROS,   Endo/Other  negative endocrine ROS  Renal/GU negative Renal ROS  negative genitourinary   Musculoskeletal negative musculoskeletal ROS (+)   Abdominal   Peds negative pediatric ROS (+)  Hematology negative hematology ROS (+)   Anesthesia Other Findings   Reproductive/Obstetrics negative OB ROS                             Anesthesia Physical Anesthesia Plan  ASA: III  Anesthesia Plan: General   Post-op Pain Management:    Induction: Intravenous  PONV Risk Score and Plan: TIVA, Propofol infusion, Treatment may vary due to age or medical condition and Ondansetron  Airway Management Planned: Simple Face Mask and Nasal Cannula  Additional Equipment:   Intra-op Plan:   Post-operative Plan:   Informed Consent: I have reviewed the patients History and Physical, chart, labs and discussed the procedure including the risks, benefits and alternatives for the proposed anesthesia with the patient or authorized representative who has indicated his/her understanding and acceptance.     Dental advisory given  Plan  Discussed with: CRNA  Anesthesia Plan Comments: (Plan Full PPE use  Plan GA with GETA as needed d/w pt -WTP with same after Q&A)        Anesthesia Quick Evaluation

## 2019-10-25 ENCOUNTER — Encounter: Payer: Self-pay | Admitting: Internal Medicine

## 2019-10-25 LAB — SURGICAL PATHOLOGY

## 2019-10-27 ENCOUNTER — Encounter (HOSPITAL_COMMUNITY): Payer: Self-pay | Admitting: Internal Medicine

## 2019-10-28 ENCOUNTER — Other Ambulatory Visit: Payer: Self-pay | Admitting: Family Medicine

## 2019-11-16 ENCOUNTER — Encounter: Payer: Self-pay | Admitting: Family Medicine

## 2019-11-16 ENCOUNTER — Other Ambulatory Visit: Payer: Self-pay

## 2019-11-16 ENCOUNTER — Ambulatory Visit (INDEPENDENT_AMBULATORY_CARE_PROVIDER_SITE_OTHER): Payer: 59 | Admitting: Family Medicine

## 2019-11-16 VITALS — BP 147/81

## 2019-11-16 DIAGNOSIS — F5101 Primary insomnia: Secondary | ICD-10-CM | POA: Diagnosis not present

## 2019-11-16 DIAGNOSIS — F411 Generalized anxiety disorder: Secondary | ICD-10-CM | POA: Diagnosis not present

## 2019-11-16 DIAGNOSIS — J4540 Moderate persistent asthma, uncomplicated: Secondary | ICD-10-CM | POA: Diagnosis not present

## 2019-11-16 DIAGNOSIS — I10 Essential (primary) hypertension: Secondary | ICD-10-CM | POA: Diagnosis not present

## 2019-11-16 MED ORDER — FUROSEMIDE 20 MG PO TABS: ORAL_TABLET | ORAL | 5 refills | Status: DC

## 2019-11-16 MED ORDER — BUPROPION HCL ER (SR) 150 MG PO TB12: 150.0000 mg | ORAL_TABLET | Freq: Two times a day (BID) | ORAL | 5 refills | Status: DC

## 2019-11-16 MED ORDER — POTASSIUM CHLORIDE CRYS ER 20 MEQ PO TBCR: 20.0000 meq | EXTENDED_RELEASE_TABLET | Freq: Every day | ORAL | 5 refills | Status: DC

## 2019-11-16 MED ORDER — AMLODIPINE BESY-BENAZEPRIL HCL 5-20 MG PO CAPS: 1.0000 | ORAL_CAPSULE | Freq: Every day | ORAL | 5 refills | Status: DC

## 2019-11-16 NOTE — Progress Notes (Signed)
   Subjective:  Audio only  Patient calls to follow-up with multiple concerns  Patient ID: Ronald Juarez, male    DOB: Mar 09, 1956, 63 y.o.   MRN: [id number]  Hypertension This is a chronic problem. The current episode started more than 1 year ago. Risk factors for coronary artery disease include male gender. Treatments tried: lotrel, lasix. There are no compliance problems.    Joints seen to stay sore now- takes time to get moving   Review of Systems   Virtual Visit via Video Note  I connected with Eveline Keto on 11/16/19 at  9:00 AM EST by a video enabled telemedicine application and verified that I am speaking with the correct person using two identifiers.  Location: Patient: home Provider: office   I discussed the limitations of evaluation and management by telemedicine and the availability of in person appointments. The patient expressed understanding and agreed to proceed.  History of Present Illness:    Observations/Objective:   Assessment and Plan:   Follow Up Instructions:    I discussed the assessment and treatment plan with the patient. The patient was provided an opportunity to ask questions and all were answered. The patient agreed with the plan and demonstrated an understanding of the instructions.   The patient was advised to call back or seek an in-person evaluation if the symptoms worsen or if the condition fails to improve as anticipated.  I provided 25 minutes of non-face-to-face time during this encounter.   Blood pressure medicine and blood pressure levels reviewed today with patient. Compliant with blood pressure medicine. States does not miss a dose. No obvious side effects. Blood pressure generally good when checked elsewhere. Watching salt intake.  Asthma..  Overall stable.  Compliant with medications.  Rare use of rescue inhaler.  Definitely medicine is helping  Anxiety.  Ongoing.  Substantial stress this past year with the loss of his wife.   Compliant with medications.  Overall medicine still definitely helps.  insom.  Uses meds faithfully  Patient compliant with insomnia medication. Generally takes most nights. No obvious morning drowsiness. Definitely helps patient sleep. Without it patient states would not get a good nights rest.   Colon just done, No headache, no major weight loss or weight gain, no chest pain no back pain abdominal pain no change in bowel habits complete ROS otherwise negative     Objective:   Physical Exam  Virtual      Assessment & Plan:  Impression hypertension overall good control discussed maintain same meds  2.  Generalized anxiety disorder.  With element of grief in addition.  Discussed.  Maintain same meds exercise encouraged  3.  Asthma clinically stable.  Compliance with Advair encouraged.  Overall good control  4.  Insomnia ongoing need for meds meds refilled  Follow-up in 6 months.  Hopefully face-to-face and with [facility]

## 2020-01-18 ENCOUNTER — Other Ambulatory Visit: Payer: Self-pay | Admitting: Family Medicine

## 2020-01-19 ENCOUNTER — Encounter: Payer: Self-pay | Admitting: Family Medicine

## 2020-02-10 ENCOUNTER — Other Ambulatory Visit: Payer: Self-pay | Admitting: *Deleted

## 2020-02-10 ENCOUNTER — Telehealth: Payer: Self-pay | Admitting: Family Medicine

## 2020-02-10 MED ORDER — TRIAMCINOLONE ACETONIDE 0.1 % EX CREA: 1.0000 "application " | TOPICAL_CREAM | Freq: Two times a day (BID) | CUTANEOUS | 2 refills | Status: DC

## 2020-02-10 NOTE — Telephone Encounter (Signed)
Refill sent to pharm and pt said he did not need a call back he would check with pharm later

## 2020-02-10 NOTE — Telephone Encounter (Signed)
May go ahead with this apply twice daily as needed 45 g tube with 2 refills

## 2020-02-10 NOTE — Telephone Encounter (Signed)
Not on current med list but under history. Takes for venous stasis dermatitis. Prescribed 01/21/17 and refilled 07/07/17. States the cream worked well and when he ran out he just been using coconut. Pt states he does not need a call back unless there is a problem with him getting refill. He will check with pharm later this evening.

## 2020-02-10 NOTE — Telephone Encounter (Signed)
Pt is requesting refill on triamcinolone cream (KENALOG) 0.1 %.   [facility] [id number] - [address], [address] AT [address]

## 2020-02-14 ENCOUNTER — Ambulatory Visit (INDEPENDENT_AMBULATORY_CARE_PROVIDER_SITE_OTHER): Payer: 59 | Admitting: Family Medicine

## 2020-02-14 ENCOUNTER — Encounter: Payer: Self-pay | Admitting: Family Medicine

## 2020-02-14 ENCOUNTER — Other Ambulatory Visit (HOSPITAL_COMMUNITY)
Admission: RE | Admit: 2020-02-14 | Discharge: 2020-02-14 | Disposition: A | Discharge status: Home / Self Care | Payer: 59 | Source: Ambulatory Visit | Attending: Family Medicine | Admitting: Family Medicine

## 2020-02-14 ENCOUNTER — Other Ambulatory Visit: Payer: Self-pay

## 2020-02-14 ENCOUNTER — Ambulatory Visit (HOSPITAL_COMMUNITY)
Admission: RE | Admit: 2020-02-14 | Discharge: 2020-02-14 | Disposition: A | Discharge status: Home / Self Care | Payer: 59 | Source: Ambulatory Visit | Attending: Family Medicine | Admitting: Family Medicine

## 2020-02-14 VITALS — BP 130/82 | Temp 96.9°F | Wt 276.4 lb

## 2020-02-14 DIAGNOSIS — I10 Essential (primary) hypertension: Secondary | ICD-10-CM

## 2020-02-14 DIAGNOSIS — M79604 Pain in right leg: Secondary | ICD-10-CM

## 2020-02-14 DIAGNOSIS — R5383 Other fatigue: Secondary | ICD-10-CM

## 2020-02-14 DIAGNOSIS — Z125 Encounter for screening for malignant neoplasm of prostate: Secondary | ICD-10-CM

## 2020-02-14 DIAGNOSIS — I878 Other specified disorders of veins: Secondary | ICD-10-CM

## 2020-02-14 DIAGNOSIS — R7989 Other specified abnormal findings of blood chemistry: Secondary | ICD-10-CM

## 2020-02-14 DIAGNOSIS — R600 Localized edema: Secondary | ICD-10-CM | POA: Diagnosis not present

## 2020-02-14 DIAGNOSIS — I872 Venous insufficiency (chronic) (peripheral): Secondary | ICD-10-CM | POA: Diagnosis not present

## 2020-02-14 DIAGNOSIS — M79605 Pain in left leg: Secondary | ICD-10-CM

## 2020-02-14 LAB — D-DIMER, QUANTITATIVE: D-Dimer, Quant: 0.65 ug/mL-FEU — ABNORMAL HIGH (ref 0.00–0.50)

## 2020-02-14 MED ORDER — TORSEMIDE 20 MG PO TABS: ORAL_TABLET | ORAL | 4 refills | Status: DC

## 2020-02-14 NOTE — Progress Notes (Signed)
   Subjective:    Patient ID: Ronald Juarez, male    DOB: 1956/11/19, 64 y.o.   MRN: [id number]  HPI Pt is having leg swelling in both legs. Pt states this started a couple days ago. Some pain yesterday from knees to bottom of feet. 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 Pt is taking 2 Lasix. Pt states elevation is not helping  Severe swelling in the legs he is also having burning pain and discomfort.  He relates in pain aches from the knees down to the bottom of feet and into the calf.  Stands all day long.  Using Lasix.  Elevating the legs does not help enough.  Does use compression hose that he has bought himself for go to his knees.  He feels he is doing adequate job.  Patient denies any chest tightness pressure pain or shortness of breath Patient denies any chest tightness pressure pain denies shortness of breath denies PND denies orthopnea Patient is suffering with grief related to the loss of spouse approximately 1 year ago but he is trying to function through as best he can  Review of Systems  Constitutional: Negative for activity change.  HENT: Negative for congestion and rhinorrhea.   Respiratory: Negative for cough and shortness of breath.   Cardiovascular: Positive for leg swelling. Negative for chest pain.  Gastrointestinal: Negative for abdominal pain, diarrhea, nausea and vomiting.  Genitourinary: Negative for dysuria and hematuria.  Neurological: Negative for weakness and headaches.  Psychiatric/Behavioral: Negative for behavioral problems and confusion.       Objective:   Physical Exam Lungs clear respiratory rate normal heart is regular no murmurs pulses are normal blood pressure good extremities 2+ pitting edema up to his knees also has venous stasis changes bilateral.  In addition to this Patient also has some tenderness in the calf muscles bilateral      Assessment & Plan:  Bilateral leg swelling with also leg pain it is best to go ahead and refer  this patient for bilateral Dopplers to be done today.  Patient will need stat D-dimer we will also switch from furosemide to Demadex 1-2 each morning and have the patient do a follow-up later this spring patient to do lab work before that follow-up Need to rule out the possibility of DVT More than likely severe venous insufficiency and venous stasis no sign of any venous ulcers at this time  Should be noted that the D-dimer came back positive therefore the patient will need to have a follow-up ultrasound again  Today's ultrasound negative for DVT We did prescribe Demadex which should help with the edema that he is having

## 2020-02-14 NOTE — Progress Notes (Signed)
Repeat ultrasound scheduled Monday March 8,2021 at 12:30pm- arrive at 12:15pm Telephone call-voicemail not set up

## 2020-02-20 ENCOUNTER — Other Ambulatory Visit: Payer: Self-pay

## 2020-02-20 ENCOUNTER — Ambulatory Visit (HOSPITAL_COMMUNITY)
Admission: RE | Admit: 2020-02-20 | Discharge: 2020-02-20 | Disposition: A | Discharge status: Home / Self Care | Payer: 59 | Source: Ambulatory Visit | Attending: Family Medicine | Admitting: Family Medicine

## 2020-02-20 DIAGNOSIS — R600 Localized edema: Secondary | ICD-10-CM | POA: Insufficient documentation

## 2020-02-20 DIAGNOSIS — R7989 Other specified abnormal findings of blood chemistry: Secondary | ICD-10-CM | POA: Insufficient documentation

## 2020-02-28 ENCOUNTER — Telehealth: Payer: Self-pay | Admitting: Family Medicine

## 2020-02-28 NOTE — Telephone Encounter (Signed)
Pt is not due for CPE until June. But called today to have lab work ordered and wanted to know if Dr. Richardson Landry wanted him to come in for an OV but do his physical in May. Insurance will not cover physical until after 6/3.

## 2020-02-28 NOTE — Telephone Encounter (Signed)
Can we reword question? confusing

## 2020-02-28 NOTE — Telephone Encounter (Signed)
Pt came in on 3/2 and saw dr Nicki Reaper. Pt states dr Nicki Reaper mentioned to him he may want to come in may for med check and do his physical when it was due in june

## 2020-02-28 NOTE — Telephone Encounter (Signed)
Called and discussed with pt. He states he already has orders that were given in march to do in may. He states he will just do the physical in June.

## 2020-02-28 NOTE — Telephone Encounter (Signed)
Probably only after pt questioned re that, we can do both and only charge the chronic cdtn part in may(and not charge a pe)

## 2020-03-22 ENCOUNTER — Other Ambulatory Visit: Payer: Self-pay | Admitting: Family Medicine

## 2020-03-22 ENCOUNTER — Other Ambulatory Visit: Payer: Self-pay

## 2020-03-22 ENCOUNTER — Ambulatory Visit: Payer: 59 | Attending: Internal Medicine

## 2020-03-22 DIAGNOSIS — Z20822 Contact with and (suspected) exposure to covid-19: Secondary | ICD-10-CM

## 2020-03-23 ENCOUNTER — Telehealth: Payer: Self-pay | Admitting: Family Medicine

## 2020-03-23 LAB — SARS-COV-2, NAA 2 DAY TAT

## 2020-03-23 LAB — NOVEL CORONAVIRUS, NAA: SARS-CoV-2, NAA: NOT DETECTED

## 2020-03-23 NOTE — Telephone Encounter (Signed)
Please give work excuse and let pt know

## 2020-03-23 NOTE — Telephone Encounter (Signed)
With the assumption that his test was negative and he is feeling better and he may have a note to return back to work

## 2020-03-23 NOTE — Telephone Encounter (Signed)
Pt states he had upset stomach one day and was told he needed covid test. Pt notified test was negative and he states he is feeling fine and wants note to return to work

## 2020-03-23 NOTE — Telephone Encounter (Signed)
Pt calling to check on Covid results  Will need a work excuse if results are negative  Please advise & call pt

## 2020-03-26 ENCOUNTER — Encounter: Payer: Self-pay | Admitting: Family Medicine

## 2020-04-22 ENCOUNTER — Other Ambulatory Visit: Payer: Self-pay | Admitting: Family Medicine

## 2020-04-23 NOTE — Telephone Encounter (Signed)
11/16/19 last med check up

## 2020-05-08 LAB — CBC WITH DIFFERENTIAL/PLATELET
Basophils Absolute: 0 10*3/uL (ref 0.0–0.2)
Basos: 1 %
EOS (ABSOLUTE): 0.1 10*3/uL (ref 0.0–0.4)
Eos: 2 %
Hematocrit: 46 % (ref 37.5–51.0)
Hemoglobin: 15.2 g/dL (ref 13.0–17.7)
Immature Grans (Abs): 0 10*3/uL (ref 0.0–0.1)
Immature Granulocytes: 1 %
Lymphocytes Absolute: 1.5 10*3/uL (ref 0.7–3.1)
Lymphs: 22 %
MCH: 31.3 pg (ref 26.6–33.0)
MCHC: 33 g/dL (ref 31.5–35.7)
MCV: 95 fL (ref 79–97)
Monocytes Absolute: 0.6 10*3/uL (ref 0.1–0.9)
Monocytes: 9 %
Neutrophils Absolute: 4.3 10*3/uL (ref 1.4–7.0)
Neutrophils: 65 %
Platelets: 222 10*3/uL (ref 150–450)
RBC: 4.86 x10E6/uL (ref 4.14–5.80)
RDW: 12.7 % (ref 11.6–15.4)
WBC: 6.6 10*3/uL (ref 3.4–10.8)

## 2020-05-08 LAB — HEPATIC FUNCTION PANEL
ALT: 30 IU/L (ref 0–44)
AST: 22 IU/L (ref 0–40)
Albumin: 4.6 g/dL (ref 3.8–4.8)
Alkaline Phosphatase: 73 IU/L (ref 48–121)
Bilirubin Total: 0.4 mg/dL (ref 0.0–1.2)
Bilirubin, Direct: 0.11 mg/dL (ref 0.00–0.40)
Total Protein: 7.1 g/dL (ref 6.0–8.5)

## 2020-05-08 LAB — BASIC METABOLIC PANEL
BUN/Creatinine Ratio: 19 (ref 10–24)
BUN: 18 mg/dL (ref 8–27)
CO2: 25 mmol/L (ref 20–29)
Calcium: 9.3 mg/dL (ref 8.6–10.2)
Chloride: 102 mmol/L (ref 96–106)
Creatinine, Ser: 0.94 mg/dL (ref 0.76–1.27)
GFR calc Af Amer: 99 mL/min/{1.73_m2} (ref >59)
GFR calc non Af Amer: 86 mL/min/{1.73_m2} (ref >59)
Glucose: 102 mg/dL — ABNORMAL HIGH (ref 65–99)
Potassium: 4.9 mmol/L (ref 3.5–5.2)
Sodium: 140 mmol/L (ref 134–144)

## 2020-05-08 LAB — PSA: Prostate Specific Ag, Serum: 1.7 ng/mL (ref 0.0–4.0)

## 2020-05-08 LAB — TSH: TSH: 2.48 u[IU]/mL (ref 0.450–4.500)

## 2020-05-09 ENCOUNTER — Other Ambulatory Visit: Payer: Self-pay | Admitting: Family Medicine

## 2020-05-21 ENCOUNTER — Telehealth: Payer: Self-pay | Admitting: Family Medicine

## 2020-05-21 ENCOUNTER — Ambulatory Visit (INDEPENDENT_AMBULATORY_CARE_PROVIDER_SITE_OTHER): Payer: 59 | Admitting: Family Medicine

## 2020-05-21 ENCOUNTER — Encounter: Payer: Self-pay | Admitting: Family Medicine

## 2020-05-21 ENCOUNTER — Other Ambulatory Visit: Payer: Self-pay

## 2020-05-21 VITALS — BP 124/76 | HR 89 | Temp 98.1°F | Ht 72.0 in | Wt 276.0 lb

## 2020-05-21 DIAGNOSIS — D229 Melanocytic nevi, unspecified: Secondary | ICD-10-CM

## 2020-05-21 DIAGNOSIS — R7301 Impaired fasting glucose: Secondary | ICD-10-CM

## 2020-05-21 DIAGNOSIS — Z0000 Encounter for general adult medical examination without abnormal findings: Secondary | ICD-10-CM | POA: Diagnosis not present

## 2020-05-21 DIAGNOSIS — J449 Chronic obstructive pulmonary disease, unspecified: Secondary | ICD-10-CM

## 2020-05-21 DIAGNOSIS — R233 Spontaneous ecchymoses: Secondary | ICD-10-CM

## 2020-05-21 DIAGNOSIS — I878 Other specified disorders of veins: Secondary | ICD-10-CM

## 2020-05-21 DIAGNOSIS — G4700 Insomnia, unspecified: Secondary | ICD-10-CM

## 2020-05-21 MED ORDER — TRAZODONE HCL 50 MG PO TABS: ORAL_TABLET | ORAL | 1 refills | Status: DC

## 2020-05-21 MED ORDER — BUPROPION HCL ER (SR) 150 MG PO TB12: 150.0000 mg | ORAL_TABLET | Freq: Two times a day (BID) | ORAL | 5 refills | Status: DC

## 2020-05-21 MED ORDER — ADVAIR HFA 115-21 MCG/ACT IN AERO: 2.0000 | INHALATION_SPRAY | Freq: Two times a day (BID) | RESPIRATORY_TRACT | 3 refills | Status: DC | PRN

## 2020-05-21 MED ORDER — POTASSIUM CHLORIDE CRYS ER 20 MEQ PO TBCR: 20.0000 meq | EXTENDED_RELEASE_TABLET | Freq: Every day | ORAL | 5 refills | Status: DC

## 2020-05-21 MED ORDER — AMLODIPINE BESY-BENAZEPRIL HCL 5-20 MG PO CAPS: 1.0000 | ORAL_CAPSULE | Freq: Every day | ORAL | 5 refills | Status: DC

## 2020-05-21 NOTE — Telephone Encounter (Signed)
Patient is wanting all prescriptions sent to [facility] Advair HFA, Ventolin HFA was seen this morning.

## 2020-05-21 NOTE — Telephone Encounter (Signed)
Hi,  I asked pt when in room he said didn't need others, but I just sent all that needed refills.  Including the flovent.  Has refills there already for albuterol.   Thx.   Dr. Lovena Le

## 2020-05-21 NOTE — Telephone Encounter (Signed)
Please advise. Thank you

## 2020-05-21 NOTE — Addendum Note (Signed)
Addended by: Erven Colla on: 05/21/2020 04:27 PM   Modules accepted: Orders

## 2020-05-21 NOTE — Telephone Encounter (Signed)
Pt contacted and verbalized understanding.  

## 2020-05-21 NOTE — Progress Notes (Signed)
Patient ID: Ronald Juarez, male    DOB: [birth year], 64 y.o.   MRN: [id number]   Chief Complaint  Patient presents with   Annual Exam   Subjective:    HPI Pt seen as annual visit.     The patient comes in today for a wellness visit. Doing well.  Slightly elevated glucose 102. Had eaten and coffee that morning. Has been slightly elevated in 101 in past.  Insomnia- Taken trazodone 1x in past. Michela Pitcher it worked well for him.  "borrowed a tab from family member." Used to take xanax 0.5mg  qhs in the past.  Only drinking alcohol 1-2x per month.  Needing refill on albuterol. Not needing refills on other meds yet. Taking demadex for lowe leg swelling. Has venous stasis and using triamcinolone cream.  HTN- taking bp meds w/o difficulty.  Depression- taking wellbutrin and doing well.  Skin-  3 moles on chest.  1 is reddish and scabs/bleeds and re-occurs, other 2 itchy nad occ bleeding.   A review of their health history was completed.  A review of medications was also completed.  Any needed refills; [facility] [place]. [facility] and [facility].   Eating habits: healht conscious  Falls/  MVA accidents in past few months: none  Regular exercise: walking  Specialist pt sees on regular basis: none  Preventative health issues were discussed.   Additional concerns: mole on chest  Back pain/ left hip pain.    Medical History Ronald Juarez has a past medical history of Allergic rhinitis, Anxiety, COPD (chronic obstructive pulmonary disease) ([place]), Depression, ED (erectile dysfunction), Finger laceration involving tendon, Hypertension, Insomnia, PNA (pneumonia), Reactive airways dysfunction syndrome ([place]), and Stress.   Outpatient Encounter Medications as of 05/21/2020  Medication Sig   amLODipine-benazepril (LOTREL) 5-20 MG capsule TAKE 1 CAPSULE BY MOUTH DAILY   buPROPion (WELLBUTRIN SR) 150 MG 12 hr tablet  Take 1 tablet (150 mg total) by mouth 2 (two) times daily.   fluticasone-salmeterol (ADVAIR HFA) 115-21 MCG/ACT inhaler Inhale 2 puffs into the lungs 2 (two) times daily. (Patient taking differently: Inhale 2 puffs into the lungs 2 (two) times daily as needed. )   potassium chloride SA (KLOR-CON) 20 MEQ tablet Take 1 tablet (20 mEq total) by mouth daily.   sildenafil (VIAGRA) 100 MG tablet TAKE 1 TABLET BY MOUTH ONE HOURS PRIOR TO INTERCOURSE   torsemide (DEMADEX) 20 MG tablet One to two tabs qam prn edema   triamcinolone cream (KENALOG) 0.1 % Apply 1 application topically 2 (two) times daily.   valACYclovir (VALTREX) 500 MG tablet TAKE 1 TABLET BY MOUTH TWICE DAILY   VENTOLIN HFA 108 (90 Base) MCG/ACT inhaler INHALE 2 PUFFS INTO THE LUNGS EVERY 6 HOURS AS NEEDED FOR WHEEZING   [DISCONTINUED] ALPRAZolam (XANAX) 0.5 MG tablet Take 0.5-1 tablets (0.25-0.5 mg total) by mouth at bedtime as needed.   traZODone (DESYREL) 50 MG tablet Take 1-2 tab p.o. qhs   Facility-Administered Encounter Medications as of 05/21/2020  Medication   fentaNYL (SUBLIMAZE) injection 25-50 mcg   promethazine (PHENERGAN) injection 6.25-12.5 mg     Review of Systems  Constitutional: Negative for chills and fever.  HENT: Negative for congestion, rhinorrhea and sore throat.   Respiratory: Negative for cough, shortness of breath and wheezing.   Cardiovascular: Negative for chest pain and leg swelling.  Gastrointestinal: Negative for abdominal pain, diarrhea, nausea and vomiting.  Genitourinary: Negative for dysuria and frequency.  Skin: Negative for rash.       +Skin moles on chest  Neurological: Negative for dizziness, weakness and headaches.  Psychiatric/Behavioral: Positive for sleep disturbance. Negative for agitation, confusion, dysphoric mood, hallucinations, self-injury and suicidal ideas. The patient is not nervous/anxious and is not hyperactive.      Vitals BP 124/76    Pulse 89    Temp 98.1 F  (36.7 C)    Ht 6' (1.829 m)    Wt 276 lb (125.2 kg)    SpO2 96%    BMI 37.43 kg/m   Objective:   Physical Exam Vitals and nursing note reviewed.  Constitutional:      General: He is not in acute distress.    Appearance: Normal appearance. He is obese. He is not ill-appearing.  HENT:     Head: Normocephalic.     Right Ear: Tympanic membrane, ear canal and external ear normal.     Left Ear: Tympanic membrane, ear canal and external ear normal.     Nose: Nose normal. No congestion.     Mouth/Throat:     Mouth: Mucous membranes are moist.     Pharynx: No oropharyngeal exudate or posterior oropharyngeal erythema.  Eyes:     Extraocular Movements: Extraocular movements intact.     Conjunctiva/sclera: Conjunctivae normal.     Pupils: Pupils are equal, round, and reactive to light.  Cardiovascular:     Rate and Rhythm: Normal rate and regular rhythm.     Pulses: Normal pulses.     Heart sounds: Normal heart sounds. No murmur.  Pulmonary:     Effort: Pulmonary effort is normal. No respiratory distress.     Breath sounds: Normal breath sounds. No wheezing, rhonchi or rales.  Musculoskeletal:        General: Swelling (bilateral ankles, chronic) present. No deformity or signs of injury. Normal range of motion.     Cervical back: Normal range of motion.     Right lower leg: Edema present.     Left lower leg: Edema present.  Skin:    General: Skin is warm and dry.     Findings: Lesion (skin moles on rt chest/center chest. 1cm on rt chest, erythema, no bleeding, scabbing. other 2 small skin color moles in center of chest 0.5cm.) present. No rash.     Comments: +bilateral venous stasis changes on lower legs.  +2 pitting edema over ankles.   Neurological:     General: No focal deficit present.     Mental Status: He is alert and oriented to person, place, and time.     Cranial Nerves: No cranial nerve deficit.     Motor: No weakness.  Psychiatric:        Mood and Affect: Mood normal.          Behavior: Behavior normal.        Thought Content: Thought content normal.        Judgment: Judgment normal.      Assessment and Plan   1. Routine general medical examination at a health care facility  2. Bleeding skin mole  3. Numerous skin moles  4. Impaired fasting blood sugar  5. COPD mixed type ([place])  6. Venous stasis  7. Insomnia, unspecified type - traZODone (DESYREL) 50 MG tablet; Take 1-2 tab p.o. qhs  Dispense: 45 tablet; Refill: 1   Moles- Small 1cm mole on rt breast/chest area, 2 in center of chest. -scabbing and bleeding.  Pt requesting removal.   HTN- stable,  cont meds.  Labs-reviewed, stable.  Fasting bg- slightly elevated last few years will cont to monitor. Diet modification.  Copd- stable, just needing refill of albuterol. Taking flovent also.   Insomnia- trial of trazodone 1-2 tab 50mg  qhs. Used to use 0.25-0.5mg  xanax qhs.  Venous stasis- stable. - triamcinolone cream prn.  Lower leg edema- compression stockings, elevation of legs, taking demadex.  Pt not needing refills today.  F/u 1 wk for mole removal and in 2mon recheck.

## 2020-05-23 ENCOUNTER — Telehealth: Payer: Self-pay | Admitting: Family Medicine

## 2020-05-23 MED ORDER — VENTOLIN HFA 108 (90 BASE) MCG/ACT IN AERS: INHALATION_SPRAY | RESPIRATORY_TRACT | 3 refills | Status: DC

## 2020-05-23 NOTE — Telephone Encounter (Signed)
Patient needing ALL his meds being transferred from [facility] to [place] so they will have all his medications on file.  I told him I could fax over a medication list to them but he wants everything sent to [facility] so they will have it on file so that when he needs refills (which he doesn't right now) then he can just call for refills.  He said that [facility] will not transfer his rx's to [place].

## 2020-05-23 NOTE — Telephone Encounter (Signed)
Patient contacted and verbalized understanding.  

## 2020-05-23 NOTE — Telephone Encounter (Signed)
Can we send a script for Ventolin over to [facility] in [place]? [facility] states they do not [facility] script for Ventolin; our records show that refills were sent over on 04/26/20. Pt is going to call [facility] and have profile switched to them. ([facility] states to change med, pt would need to call [facility] and they will contact [facility] to have profile switched). Please advise. Thank you

## 2020-05-23 NOTE — Telephone Encounter (Signed)
All meds were sent to [facility] except [place] and Ventolin. Please advise. Thank you.

## 2020-05-23 NOTE — Telephone Encounter (Signed)
Pt can call and get his scripts sent over [facility]mself by [facility]   Thx.   Dr. Lovena Le

## 2020-06-11 ENCOUNTER — Ambulatory Visit: Payer: 59 | Admitting: Family Medicine

## 2020-06-11 ENCOUNTER — Telehealth: Payer: Self-pay | Admitting: Family Medicine

## 2020-06-11 ENCOUNTER — Other Ambulatory Visit: Payer: Self-pay

## 2020-06-11 ENCOUNTER — Encounter: Payer: Self-pay | Admitting: Family Medicine

## 2020-06-11 VITALS — BP 128/76 | HR 87 | Temp 97.6°F | Ht 72.0 in | Wt 277.4 lb

## 2020-06-11 DIAGNOSIS — D229 Melanocytic nevi, unspecified: Secondary | ICD-10-CM | POA: Diagnosis not present

## 2020-06-11 DIAGNOSIS — D235 Other benign neoplasm of skin of trunk: Secondary | ICD-10-CM

## 2020-06-11 NOTE — Telephone Encounter (Signed)
Patient is needing information on taking care of womb it didn't print on AVS and also needing stitches out on Tuesday early morning but Dr.scott has his schedule blocked next week cant add any patient until we get clearance from him. Please advise

## 2020-06-11 NOTE — Telephone Encounter (Signed)
The patient can come Tuesday to have suture removal-please schedule

## 2020-06-11 NOTE — Progress Notes (Signed)
Patient ID: Ronald Juarez, male    DOB: 1956-02-07, 64 y.o.   MRN: [id number]   Chief Complaint  Patient presents with  . Mole Removal    Moles on chest that pt would like removed. pt states that the moles on chest are aggravated by harness he has to wear at work     Subjective:    HPI Pt seen for mole removal.  Pt has 3 moles/skin tags on chest.  1 on right upper chest that intermittently bleeds and changed colors, to dark red.  The other 2 in center of chest get in way of his vest at work.  There are 2 are skin colored lesions, center of chest.  Medical History Amahd has a past medical history of Allergic rhinitis, Anxiety, COPD (chronic obstructive pulmonary disease) ([address]), Depression, ED (erectile dysfunction), Finger laceration involving tendon, Hypertension, Insomnia, PNA (pneumonia), Reactive airways dysfunction syndrome ([place]), and Stress.   Outpatient Encounter Medications as of 06/11/2020  Medication Sig  . amLODipine-benazepril (LOTREL) 5-20 MG capsule Take 1 capsule by mouth daily.  Marland Kitchen buPROPion (WELLBUTRIN SR) 150 MG 12 hr tablet Take 1 tablet (150 mg total) by mouth 2 (two) times daily.  . fluticasone-salmeterol (ADVAIR HFA) 115-21 MCG/ACT inhaler Inhale 2 puffs into the lungs 2 (two) times daily as needed.  . potassium chloride SA (KLOR-CON) 20 MEQ tablet Take 1 tablet (20 mEq total) by mouth daily.  . sildenafil (VIAGRA) 100 MG tablet TAKE 1 TABLET BY MOUTH ONE HOURS PRIOR TO INTERCOURSE  . torsemide (DEMADEX) 20 MG tablet One to two tabs qam prn edema  . traZODone (DESYREL) 50 MG tablet Take 1-2 tab p.o. qhs  . triamcinolone cream (KENALOG) 0.1 % Apply 1 application topically 2 (two) times daily.  . valACYclovir (VALTREX) 500 MG tablet TAKE 1 TABLET BY MOUTH TWICE DAILY  . VENTOLIN HFA 108 (90 Base) MCG/ACT inhaler INHALE 2 PUFFS INTO THE LUNGS EVERY 6 HOURS AS NEEDED FOR WHEEZING   Facility-Administered Encounter Medications as of 06/11/2020  Medication  . fentaNYL  (SUBLIMAZE) injection 25-50 mcg  . promethazine (PHENERGAN) injection 6.25-12.5 mg     Review of Systems  Skin:       3 moles on chest.    Vitals BP 128/76   Pulse 87   Temp 97.6 F (36.4 C)   Ht 6' (1.829 m)   Wt 277 lb 6.4 oz (125.8 kg)   SpO2 94%   BMI 37.62 kg/m   Objective:   Physical Exam Vitals and nursing note reviewed.  Constitutional:      General: He is not in acute distress.    Appearance: Normal appearance.  Skin:    General: Skin is warm and dry.     Comments: Upper chest center- 0.5cm skin colored mole, round. Lower center chest- 0.5cm skin colored mole, round.  Right upper chest- 1cm reddish, round, raised mole.    Neurological:     Mental Status: He is alert.    Assessment and Plan   1. Nevus bleeding  2. Dermal nevus of chest   (1-) bleeding nevus- rt upper chest.- removed by elliptical excision (2)- skin colored dermal nevus center of chest.  - removed by shave.   Diagnosis: Nevus x3 - Location: chest Procedure:Mole removals Informed consent:  Discussed risks infection, pain, bleeding, bruising,  and recurrence of the condition) and benefits of the procedure, as well as the alternatives.  Informed consent was obtained. Anesthesia: Lidocaine with 2% epi Type: removal with scalpel  on 2 of the moles center of chest, 3rd mole on right chest was removed with excision and required sutures. The area was prepared and draped in a standard fashion. Antibiotic ointment and a sterile pressure dressing were applied.  3rd mole removal by excision, required 3 ethilon sutures, interrupted.  The patient tolerated the procedure well. The patient was instructed on post-op care. All 3 samples sent for pathology.  F/u  7 days for suture removal.

## 2020-06-11 NOTE — Telephone Encounter (Signed)
Hi,   Is it ok if this pt returns on mon/Tuesday to have sutures removed from right chest?  3 sutures.   Thx.   Shelly

## 2020-06-11 NOTE — Telephone Encounter (Signed)
Pls call pt or send his "wound care" instructions through mychart or he can pick it up.  Apologies that it wasn't in there at time of discharge.   Follow up in 7-8 days for stitch removal.   Thx.   Dr. Lovena Le

## 2020-06-11 NOTE — Patient Instructions (Signed)
Sutured Wound Care  Sutures are stitches that can be used to close wounds. Taking care of your wound properly can help prevent pain and infection. It can also help your wound to heal more quickly. Follow instructions from your doctor about how to care for your sutured wound.  Supplies needed:  · Soap and water.  · A clean bandage (dressing), if needed.  · Antibiotic ointment.  · A clean towel.  How to care for your sutured wound    · Keep the wound completely dry for the first 24 hours or as long as told by your doctor. After 24-48 hours, you may shower or bathe as told by your doctor. Do not soak the wound or put the wound completely under water until the sutures have been removed.  · After the first 24 hours, clean the wound once a day, or as often as your doctor tells you to. Take these steps:  ? Wash the wound with soap and water.  ? Rinse the wound with water. Make sure to wash all the soap off.  ? Pat the wound dry with a clean towel. Do not rub the wound.  · After cleaning the wound, put a thin layer of antibiotic ointment on the wound as told by your doctor. This will help:  ? Prevent infection.  ? Keep the bandage from sticking to the wound.  · Follow instructions from your doctor about how to change your bandage:  ? Wash your hands with soap and water. If you cannot use soap and water, use hand sanitizer.  ? Change your bandage at least once a day, or as often as told by your doctor. If your dressing gets wet or dirty, change it.  ? Leave sutures, skin glue, or skin tape (adhesive) strips in place. They may need to stay in place for 2 weeks or longer. If tape strips get loose and curl up, you may trim the loose edges. Do not remove tape strips completely unless your doctor says it is okay.  · Check your wound every day for signs of infection. Watch for:  ? Redness, swelling, or pain.  ? Fluid or blood.  ? Warmth.  ? Pus or a bad smell.  · Have the sutures removed as told by your doctor.  Follow these  instructions at home:  Medicines  · Take or apply over-the-counter and prescription medicines only as told by your doctor.  · If you were prescribed an antibiotic medicine or ointment, take or apply it as told by your doctor. Do not stop using the antibiotic even if you start to feel better.  General instructions  · Cover your wound with clothes or put sunscreen on when you are outside. Use a sunscreen of at least 30 SPF.  · Do not scratch or pick at your wound.  · Avoid stretching your wound.  · Raise (elevate) the injured area above the level of your heart while you are sitting or lying down, if possible.  · Drink enough fluids to keep your pee (urine) clear or pale yellow.  · Keep all follow-up visits as told by your doctor. This is important.  Contact a doctor if:  · You were given a tetanus shot and you have any of the following at the site where the needle went in:  ? Swelling.  ? Very bad pain.  ? Redness.  ? Bleeding.  · Your wound breaks open.  · You have redness, swelling, or pain around your   get better with medicine.  The skin near your wound changes color.  You need to change your bandage often due to a lot of fluid, blood, or pus coming from the wound.  You get a new rash.  You get numbness around the wound. Get help right away if:  You have very bad swelling around your wound.  You have pus or a bad smell coming from your wound.  Your pain suddenly gets worse and is very bad.  You have painful lumps near your wound or anywhere on your body.  You have a red streak going away from your wound.  The wound is on your hand or foot, and: ? You cannot move a finger or toe as you used to do. ? Your fingers or toes look pale or blue. ? You have numbness that spreads down  your hand, foot, fingers, or toes. Summary  Sutures are stitches that are used to close wounds.  Taking care of your wound properly can help prevent pain and infection.  Keep the wound completely dry for the first 24 hours or for as long as told by your doctor. After 24-48 hours, you may shower or bathe as directed by your doctor. This information is not intended to replace advice given to you by your health care provider. Make sure you discuss any questions you have with your health care provider. Document Revised: 11/13/2017 Document Reviewed: 01/06/2017 Elsevier Patient Education  2020 [employer].

## 2020-06-11 NOTE — Telephone Encounter (Signed)
Please see Dr. Tanna Furry message --pt can be scheduled with Dr. Nicki Reaper next Tuesday for suture removal and please send wound care instructions from today's note.

## 2020-06-13 ENCOUNTER — Encounter: Payer: Self-pay | Admitting: Family Medicine

## 2020-06-14 ENCOUNTER — Encounter: Payer: Self-pay | Admitting: Family Medicine

## 2020-06-14 ENCOUNTER — Telehealth: Payer: Self-pay | Admitting: *Deleted

## 2020-06-14 DIAGNOSIS — C4359 Malignant melanoma of other part of trunk: Secondary | ICD-10-CM | POA: Insufficient documentation

## 2020-06-14 DIAGNOSIS — Z85820 Personal history of malignant melanoma of skin: Secondary | ICD-10-CM | POA: Insufficient documentation

## 2020-06-14 DIAGNOSIS — C439 Malignant melanoma of skin, unspecified: Secondary | ICD-10-CM

## 2020-06-14 NOTE — Telephone Encounter (Signed)
Urgent referral put in for skin surgeon.

## 2020-06-14 NOTE — Telephone Encounter (Signed)
[facility] called to report the third specimen from right side of chest came back malignant melanoma with clark level 4 and 3 mm thickness. I asked for report to be faxed also and they will be faxing.

## 2020-06-19 ENCOUNTER — Ambulatory Visit: Payer: 59 | Admitting: Family Medicine

## 2020-06-19 ENCOUNTER — Other Ambulatory Visit: Payer: Self-pay

## 2020-06-19 ENCOUNTER — Encounter: Payer: Self-pay | Admitting: Family Medicine

## 2020-06-19 VITALS — BP 140/84 | Temp 97.8°F | Ht 72.0 in | Wt 279.0 lb

## 2020-06-19 DIAGNOSIS — C439 Malignant melanoma of skin, unspecified: Secondary | ICD-10-CM

## 2020-06-19 NOTE — Progress Notes (Signed)
   Subjective:    Patient ID: Ronald Juarez, male    DOB: 19-Apr-1956, 64 y.o.   MRN: [id number]  HPIpt arrives for suture removal. Had three moles removed on 06/11/20 from chest.  We did review Pathology with the patient.  Referral to skin center.  Questions answered.  Review of Systems     Objective:   Physical Exam  Sutures removed without difficulty.      Assessment & Plan:  Melanoma referral to skin center Patient was told to notify us if he has not heard from them within the next 10 days

## 2020-07-11 ENCOUNTER — Other Ambulatory Visit: Payer: Self-pay

## 2020-09-12 ENCOUNTER — Telehealth: Payer: Self-pay | Admitting: Family Medicine

## 2020-09-12 NOTE — Telephone Encounter (Signed)
Tried to call patient; no voicemail set up at this time.

## 2020-09-12 NOTE — Telephone Encounter (Signed)
Pt calling to check on message that was suppose to be sent to our office by Dr.Dabbs [facility]). Dr.Dabbs would like pt to be on a Prednisone Pack due to issues with sciatic nerve. Pt is seeing chiropractor ever day this week to try to combat the sciatic nerve also. Please advise. Thank you.  [facility].

## 2020-09-12 NOTE — Telephone Encounter (Signed)
Pt needs to be seen, thx. Dr. Lovena Le

## 2020-09-12 NOTE — Telephone Encounter (Signed)
Pt returned call. Pt states he is not going to spend more money and try to take off work and risk getting fired. Pt states he will let chiropractor know that he is not going to do the Prednisone.

## 2020-10-24 ENCOUNTER — Other Ambulatory Visit: Payer: Self-pay | Admitting: Orthopaedic Surgery

## 2020-10-31 NOTE — Patient Instructions (Addendum)
DUE TO COVID-19 ONLY ONE VISITOR IS ALLOWED TO COME WITH YOU AND STAY IN THE WAITING ROOM ONLY DURING PRE OP AND PROCEDURE DAY OF SURGERY. THE 1 VISITOR  MAY VISIT WITH YOU AFTER SURGERY IN YOUR PRIVATE ROOM DURING VISITING HOURS ONLY!  YOU NEED TO HAVE A COVID 19 TEST ON: 11/09/20 @ 10:00 AM, THIS TEST MUST BE DONE BEFORE SURGERY,  COVID TESTING SITE [place] [address], IT IS ON THE RIGHT GOING OUT [address] APPROXIMATELY  2 MINUTES PAST [employer] ON THE RIGHT. ONCE YOUR COVID TEST IS COMPLETED,  PLEASE BEGIN THE QUARANTINE INSTRUCTIONS AS OUTLINED IN YOUR HANDOUT.                Ronald Juarez    Your procedure is scheduled on: 11/13/20   Report to [facility] Main  Entrance   Report to short stay at: 5:30 AM     Call this number if you have problems the morning of surgery [phone]    Remember:   NO SOLID FOOD AFTER MIDNIGHT THE NIGHT PRIOR TO SURGERY. NOTHING BY MOUTH EXCEPT CLEAR LIQUIDS UNTIL: 4:30 AM . PLEASE FINISH ENSURE DRINK PER SURGEON ORDER  WHICH NEEDS TO BE COMPLETED AT: 4:30 AM .  CLEAR LIQUID DIET   Foods Allowed                                                                     Foods Excluded  Coffee and tea, regular and decaf                             liquids that you cannot  Plain Jell-O any favor except red or purple                                           see through such as: Fruit ices (not with fruit pulp)                                     milk, soups, orange juice  Iced Popsicles                                    All solid food Carbonated beverages, regular and diet                                    Cranberry, grape and apple juices Sports drinks like Gatorade Lightly seasoned clear broth or consume(fat free) Sugar, honey syrup  Sample Menu Breakfast                                Lunch  Supper Cranberry juice                    Beef broth                             Chicken broth Jell-O                                     Grape juice                           Apple juice Coffee or tea                        Jell-O                                      Popsicle                                                Coffee or tea                        Coffee or tea  _____________________________________________________________________   BRUSH YOUR TEETH MORNING OF SURGERY AND RINSE YOUR MOUTH OUT, NO CHEWING GUM CANDY OR MINTS.     Take these medicines the morning of surgery with A SIP OF WATER: amlodipine,bupropion.                               You may not have any metal on your body including hair pins and              piercings  Do not wear jewelry, lotions, powders or perfumes, deodorant             Men may shave face and neck.   Do not bring valuables to the hospital. [place].  Contacts, dentures or bridgework may not be worn into surgery.  Leave suitcase in the car. After surgery it may be brought to your room.     Patients discharged the day of surgery will not be allowed to drive home. IF YOU ARE HAVING SURGERY AND GOING HOME THE SAME DAY, YOU MUST HAVE AN ADULT TO DRIVE YOU HOME AND BE WITH YOU FOR 24 HOURS. YOU MAY GO HOME BY TAXI OR UBER OR ORTHERWISE, BUT AN ADULT MUST ACCOMPANY YOU HOME AND STAY WITH YOU FOR 24 HOURS.  Name and phone number of your driver:  Special Instructions: N/A              Please read over the following fact sheets you were given: _____________________________________________________________________        [facility] - Preparing for Surgery Before surgery, you can play an important role.  Because skin is not sterile, your skin needs to be as free of germs as possible.  You can reduce the number of germs on your skin by washing with CHG (chlorahexidine gluconate)  soap before surgery.  CHG is an antiseptic cleaner which kills germs and bonds with the skin to continue  killing germs even after washing. Please DO NOT use if you have an allergy to CHG or antibacterial soaps.  If your skin becomes reddened/irritated stop using the CHG and inform your nurse when you arrive at Short Stay. Do not shave (including legs and underarms) for at least 48 hours prior to the first CHG shower.  You may shave your face/neck. Please follow these instructions carefully:  1.  Shower with CHG Soap the night before surgery and the  morning of Surgery.  2.  If you choose to wash your hair, wash your hair first as usual with your  normal  shampoo.  3.  After you shampoo, rinse your hair and body thoroughly to remove the  shampoo.                           4.  Use CHG as you would any other liquid soap.  You can apply chg directly  to the skin and wash                       Gently with a scrungie or clean washcloth.  5.  Apply the CHG Soap to your body ONLY FROM THE NECK DOWN.   Do not use on face/ open                           Wound or open sores. Avoid contact with eyes, ears mouth and genitals (private parts).                       Wash face,  Genitals (private parts) with your normal soap.             6.  Wash thoroughly, paying special attention to the area where your surgery  will be performed.  7.  Thoroughly rinse your body with warm water from the neck down.  8.  DO NOT shower/wash with your normal soap after using and rinsing off  the CHG Soap.                9.  Pat yourself dry with a clean towel.            10.  Wear clean pajamas.            11.  Place clean sheets on your bed the night of your first shower and do not  sleep with pets. Day of Surgery : Do not apply any lotions/deodorants the morning of surgery.  Please wear clean clothes to the hospital/surgery center.  FAILURE TO FOLLOW THESE INSTRUCTIONS MAY RESULT IN THE CANCELLATION OF YOUR SURGERY PATIENT SIGNATURE_________________________________  NURSE  SIGNATURE__________________________________  ________________________________________________________________________   Ronald Juarez  An incentive spirometer is a tool that can help keep your lungs clear and active. This tool measures how well you are filling your lungs with each breath. Taking long deep breaths may help reverse or decrease the chance of developing breathing (pulmonary) problems (especially infection) following:  A long period of time when you are unable to move or be active. BEFORE THE PROCEDURE   If the spirometer includes an indicator to show your best effort, your nurse or respiratory therapist will set it to a desired goal.  If possible, sit up straight or lean slightly forward.  Try not to slouch.  Hold the incentive spirometer in an upright position. INSTRUCTIONS FOR USE  1. Sit on the edge of your bed if possible, or sit up as far as you can in bed or on a chair. 2. Hold the incentive spirometer in an upright position. 3. Breathe out normally. 4. Place the mouthpiece in your mouth and seal your lips tightly around it. 5. Breathe in slowly and as deeply as possible, raising the piston or the ball toward the top of the column. 6. Hold your breath for 3-5 seconds or for as long as possible. Allow the piston or ball to fall to the bottom of the column. 7. Remove the mouthpiece from your mouth and breathe out normally. 8. Rest for a few seconds and repeat Steps 1 through 7 at least 10 times every 1-2 hours when you are awake. Take your time and take a few normal breaths between deep breaths. 9. The spirometer may include an indicator to show your best effort. Use the indicator as a goal to work toward during each repetition. 10. After each set of 10 deep breaths, practice coughing to be sure your lungs are clear. If you have an incision (the cut made at the time of surgery), support your incision when coughing by placing a pillow or rolled up towels firmly  against it. Once you are able to get out of bed, walk around indoors and cough well. You may stop using the incentive spirometer when instructed by your caregiver.  RISKS AND COMPLICATIONS  Take your time so you do not get dizzy or light-headed.  If you are in pain, you may need to take or ask for pain medication before doing incentive spirometry. It is harder to take a deep breath if you are having pain. AFTER USE  Rest and breathe slowly and easily.  It can be helpful to keep track of a log of your progress. Your caregiver can provide you with a simple table to help with this. If you are using the spirometer at home, follow these instructions: [place] IF:   You are having difficultly using the spirometer.  You have trouble using the spirometer as often as instructed.  Your pain medication is not giving enough relief while using the spirometer.  You develop fever of 100.5 F (38.1 C) or higher. SEEK IMMEDIATE MEDICAL CARE IF:   You cough up bloody sputum that had not been present before.  You develop fever of 102 F (38.9 C) or greater.  You develop worsening pain at or near the incision site. MAKE SURE YOU:   Understand these instructions.  Will watch your condition.  Will get help right away if you are not doing well or get worse. Document Released: 04/13/2007 Document Revised: 02/23/2012 Document Reviewed: 06/14/2007 [facility] Patient Information 2014 [place], [place].   ________________________________________________________________________

## 2020-11-01 ENCOUNTER — Encounter (HOSPITAL_COMMUNITY)
Admission: RE | Admit: 2020-11-01 | Discharge: 2020-11-01 | Disposition: A | Discharge status: Home / Self Care | Payer: BC Managed Care – PPO | Source: Ambulatory Visit | Attending: Orthopaedic Surgery | Admitting: Orthopaedic Surgery

## 2020-11-01 ENCOUNTER — Encounter (INDEPENDENT_AMBULATORY_CARE_PROVIDER_SITE_OTHER): Payer: Self-pay

## 2020-11-01 ENCOUNTER — Ambulatory Visit (HOSPITAL_COMMUNITY)
Admission: RE | Admit: 2020-11-01 | Discharge: 2020-11-01 | Disposition: A | Discharge status: Home / Self Care | Payer: BC Managed Care – PPO | Source: Ambulatory Visit | Attending: Orthopaedic Surgery | Admitting: Orthopaedic Surgery

## 2020-11-01 ENCOUNTER — Other Ambulatory Visit: Payer: Self-pay

## 2020-11-01 ENCOUNTER — Encounter (HOSPITAL_COMMUNITY): Payer: Self-pay

## 2020-11-01 DIAGNOSIS — Z01818 Encounter for other preprocedural examination: Secondary | ICD-10-CM

## 2020-11-01 HISTORY — DX: Unspecified osteoarthritis, unspecified site: M19.90

## 2020-11-01 HISTORY — DX: Malignant (primary) neoplasm, unspecified: C80.1

## 2020-11-01 HISTORY — DX: Other specified disorders of veins: I87.8

## 2020-11-01 LAB — TYPE AND SCREEN
ABO/RH(D): A POS
Antibody Screen: NEGATIVE

## 2020-11-01 LAB — BASIC METABOLIC PANEL
Anion gap: 9 (ref 5–15)
BUN: 29 mg/dL — ABNORMAL HIGH (ref 8–23)
CO2: 28 mmol/L (ref 22–32)
Calcium: 9.2 mg/dL (ref 8.9–10.3)
Chloride: 102 mmol/L (ref 98–111)
Creatinine, Ser: 0.92 mg/dL (ref 0.61–1.24)
GFR, Estimated: >60 mL/min (ref >60)
Glucose, Bld: 112 mg/dL — ABNORMAL HIGH (ref 70–99)
Potassium: 4.6 mmol/L (ref 3.5–5.1)
Sodium: 139 mmol/L (ref 135–145)

## 2020-11-01 LAB — CBC WITH DIFFERENTIAL/PLATELET
Abs Immature Granulocytes: 0.03 10*3/uL (ref 0.00–0.07)
Basophils Absolute: 0 10*3/uL (ref 0.0–0.1)
Basophils Relative: 0 %
Eosinophils Absolute: 0.1 10*3/uL (ref 0.0–0.5)
Eosinophils Relative: 1 %
HCT: 46.4 % (ref 39.0–52.0)
Hemoglobin: 15.5 g/dL (ref 13.0–17.0)
Immature Granulocytes: 0 %
Lymphocytes Relative: 16 %
Lymphs Abs: 1.3 10*3/uL (ref 0.7–4.0)
MCH: 31.8 pg (ref 26.0–34.0)
MCHC: 33.4 g/dL (ref 30.0–36.0)
MCV: 95.3 fL (ref 80.0–100.0)
Monocytes Absolute: 0.7 10*3/uL (ref 0.1–1.0)
Monocytes Relative: 9 %
Neutro Abs: 6.3 10*3/uL (ref 1.7–7.7)
Neutrophils Relative %: 74 %
Platelets: 243 10*3/uL (ref 150–400)
RBC: 4.87 MIL/uL (ref 4.22–5.81)
RDW: 12.3 % (ref 11.5–15.5)
WBC: 8.5 10*3/uL (ref 4.0–10.5)
nRBC: 0 % (ref 0.0–0.2)

## 2020-11-01 LAB — URINALYSIS, ROUTINE W REFLEX MICROSCOPIC
Bilirubin Urine: NEGATIVE
Glucose, UA: NEGATIVE mg/dL
Hgb urine dipstick: NEGATIVE
Ketones, ur: NEGATIVE mg/dL
Leukocytes,Ua: NEGATIVE
Nitrite: NEGATIVE
Protein, ur: NEGATIVE mg/dL
Specific Gravity, Urine: 1.008 (ref 1.005–1.030)
pH: 5 (ref 5.0–8.0)

## 2020-11-01 LAB — PROTIME-INR
INR: 0.9 (ref 0.8–1.2)
Prothrombin Time: 11.9 seconds (ref 11.4–15.2)

## 2020-11-01 LAB — APTT: aPTT: 29 seconds (ref 24–36)

## 2020-11-01 LAB — SURGICAL PCR SCREEN
MRSA, PCR: NEGATIVE
Staphylococcus aureus: NEGATIVE

## 2020-11-01 NOTE — Progress Notes (Signed)
COVID Vaccine Completed: NO Date COVID Vaccine completed: COVID vaccine manufacturer: Pfizer    Moderna   [employer]   PCP - Dr. Elvia Collum Cardiologist -   Chest x-ray -  EKG -  Stress Test -  ECHO -  Cardiac Cath -  Pacemaker/ICD device last checked:  Sleep Study -  CPAP -   Fasting Blood Sugar -  Checks Blood Sugar _____ times a day  Blood Thinner Instructions: Aspirin Instructions: Last Dose:  Anesthesia review: Hx: HTN  Patient denies shortness of breath, fever, cough and chest pain at PAT appointment   Patient verbalized understanding of instructions that were given to them at the PAT appointment. Patient was also instructed that they will need to review over the PAT instructions again at home before surgery.

## 2020-11-09 ENCOUNTER — Other Ambulatory Visit (HOSPITAL_COMMUNITY)
Admission: RE | Admit: 2020-11-09 | Discharge: 2020-11-09 | Disposition: A | Discharge status: Home / Self Care | Payer: BC Managed Care – PPO | Source: Ambulatory Visit | Attending: Orthopaedic Surgery | Admitting: Orthopaedic Surgery

## 2020-11-09 DIAGNOSIS — Z01812 Encounter for preprocedural laboratory examination: Secondary | ICD-10-CM | POA: Diagnosis not present

## 2020-11-09 DIAGNOSIS — Z20822 Contact with and (suspected) exposure to covid-19: Secondary | ICD-10-CM | POA: Insufficient documentation

## 2020-11-09 LAB — SARS CORONAVIRUS 2 (TAT 6-24 HRS): SARS Coronavirus 2: NEGATIVE

## 2020-11-12 MED ORDER — DEXTROSE 5 % IV SOLN
3.0000 g | INTRAVENOUS | Status: AC
  Administered 2020-11-13: 3 g via INTRAVENOUS
  Filled 2020-11-12: qty 3

## 2020-11-12 MED ORDER — BUPIVACAINE LIPOSOME 1.3 % IJ SUSP
10.0000 mL | Freq: Once | INTRAMUSCULAR | Status: DC
  Filled 2020-11-12: qty 10

## 2020-11-12 MED ORDER — TRANEXAMIC ACID 1000 MG/10ML IV SOLN
2000.0000 mg | INTRAVENOUS | Status: DC
  Filled 2020-11-12: qty 20

## 2020-11-12 NOTE — Anesthesia Preprocedure Evaluation (Addendum)
Anesthesia Evaluation  Patient identified by MRN, date of birth, ID band Patient awake    Reviewed: Allergy & Precautions, NPO status , Patient's Chart, lab work & pertinent test results  Airway Mallampati: III  TM Distance: >3 FB Neck ROM: Full    Dental   Pulmonary asthma , COPD, former smoker,    breath sounds clear to auscultation       Cardiovascular hypertension, Pt. on medications  Rhythm:Regular Rate:Normal     Neuro/Psych negative neurological ROS     GI/Hepatic negative GI ROS, Neg liver ROS,   Endo/Other  negative endocrine ROS  Renal/GU negative Renal ROS     Musculoskeletal  (+) Arthritis ,   Abdominal   Peds  Hematology negative hematology ROS (+)   Anesthesia Other Findings   Reproductive/Obstetrics                            Lab Results  Component Value Date   WBC 8.5 11/01/2020   HGB 15.5 11/01/2020   HCT 46.4 11/01/2020   MCV 95.3 11/01/2020   PLT 243 11/01/2020   Lab Results  Component Value Date   CREATININE 0.92 11/01/2020   BUN 29 (H) 11/01/2020   NA 139 11/01/2020   K 4.6 11/01/2020   CL 102 11/01/2020   CO2 28 11/01/2020    Anesthesia Physical Anesthesia Plan  ASA: III  Anesthesia Plan: Spinal and MAC   Post-op Pain Management:    Induction:   PONV Risk Score and Plan: 1 and Propofol infusion, Ondansetron and Treatment may vary due to age or medical condition  Airway Management Planned: Natural Airway and Simple Face Mask  Additional Equipment: None  Intra-op Plan:   Post-operative Plan:   Informed Consent: I have reviewed the patients History and Physical, chart, labs and discussed the procedure including the risks, benefits and alternatives for the proposed anesthesia with the patient or authorized representative who has indicated his/her understanding and acceptance.       Plan Discussed with: CRNA  Anesthesia Plan Comments:         Anesthesia Quick Evaluation

## 2020-11-12 NOTE — H&P (Addendum)
TOTAL HIP ADMISSION H&P  Patient is admitted for left total hip arthroplasty.  Subjective:  Chief Complaint: left hip pain  HPI: Ronald Juarez, 64 y.o. male, has a history of pain and functional disability in the left hip(s) due to arthritis and patient has failed non-surgical conservative treatments for greater than 12 weeks to include NSAID's and/or analgesics, flexibility and strengthening excercises, use of assistive devices, weight reduction as appropriate and activity modification.  Onset of symptoms was gradual starting 5 years ago with gradually worsening course since that time.The patient noted no past surgery on the left hip(s).  Patient currently rates pain in the left hip at 10 out of 10 with activity. Patient has night pain, worsening of pain with activity and weight bearing, trendelenberg gait, pain that interfers with activities of daily living and crepitus. Patient has evidence of subchondral cysts, subchondral sclerosis, periarticular osteophytes and joint space narrowing by imaging studies. This condition presents safety issues increasing the risk of falls.  There is no current active infection.  Patient Active Problem List   Diagnosis Date Noted  . Malignant melanoma of skin of chest ([place]) 06/14/2020  . FH: colon cancer in first degree relative <9 years old 08/16/2019  . Asthma, moderate persistent 09/20/2018  . Venous stasis 09/24/2015  . COPD mixed type ([place]) 08/27/2015  . Essential hypertension, benign 03/09/2013  . Allergic rhinitis 03/09/2013  . Generalized anxiety disorder 03/09/2013   Past Medical History:  Diagnosis Date  . Allergic rhinitis   . Anxiety   . Arthritis   . Cancer ([place])    skin melanoma  . COPD (chronic obstructive pulmonary disease) ([place])   . Depression   . ED (erectile dysfunction)   . Finger laceration involving tendon    right ring finger flexor tendon  . Hypertension   . Insomnia   . PNA (pneumonia)   . Reactive airways dysfunction  syndrome ([place])   . Stress   . Venous stasis     Past Surgical History:  Procedure Laterality Date  . BIOPSY  10/24/2019   Procedure: BIOPSY;  Surgeon: Daneil Dolin, MD;  Location[facility];  Service: Endoscopy;;  . COLONOSCOPY  2008   Dr. Gala Romney: left sided diverticula. recommend five year follow up due to [facility]  . COLONOSCOPY WITH PROPOFOL N/A 10/24/2019   Procedure: COLONOSCOPY WITH PROPOFOL;  Surgeon: Daneil Dolin, MD;  Location: [facility];  Service: Endoscopy;  Laterality: N/A;  9:15am  . FLEXOR TENDON REPAIR Right 09/07/2017   Procedure: RIGHT RING FINGER FLEXOR TENDON REPAIR;  Surgeon: Milly Jakob, MD;  Location: [place];  Service: Orthopedics;  Laterality: Right;  . pylondial cyst    . ROTATOR CUFF REPAIR      Current Facility-Administered Medications  Medication Dose Route Frequency Provider Last Rate Last Admin  . [START ON 11/13/2020] bupivacaine liposome (EXPAREL) 1.3 % injection 133 mg  10 mL Other Once Melrose Nakayama, MD      . Derrill Memo ON 11/13/2020] ceFAZolin (ANCEF) 3 g in dextrose 5 % 50 mL IVPB  3 g Intravenous On Call to OR Melrose Nakayama, MD      . Derrill Memo ON 11/13/2020] tranexamic acid (CYKLOKAPRON) 2,000 mg in sodium chloride 0.9 % 50 mL Topical Application  1,610 mg Topical To OR Melrose Nakayama, MD       Current Outpatient Medications  Medication Sig Dispense Refill Last Dose  . acetaminophen (TYLENOL) 500 MG tablet Take 500-1,000 mg by mouth 2 (two)  times daily as needed for moderate pain.     Marland Kitchen amLODipine-benazepril (LOTREL) 5-20 MG capsule Take 1 capsule by mouth daily. 30 capsule 5   . buPROPion (WELLBUTRIN SR) 150 MG 12 hr tablet Take 1 tablet (150 mg total) by mouth 2 (two) times daily. (Patient taking differently: Take 300 mg by mouth daily. ) 60 tablet 5   . fluticasone-salmeterol (ADVAIR HFA) 115-21 MCG/ACT inhaler Inhale 2 puffs into the lungs 2 (two) times daily as needed. (Patient taking differently: Inhale 2 puffs into  the lungs daily. ) 12 g 3   . furosemide (LASIX) 20 MG tablet Take 20 mg by mouth See admin instructions. Take 20 mg once daily, may take an additional 20 mg dose as needed for swelling     . ibuprofen (ADVIL) 200 MG tablet Take 1,000 mg by mouth 2 (two) times daily as needed for headache or moderate pain.     . potassium chloride SA (KLOR-CON) 20 MEQ tablet Take 1 tablet (20 mEq total) by mouth daily. 30 tablet 5   . Sodium Chloride-Xylitol (XLEAR SINUS CARE SPRAY NA) Place 2 sprays into both nostrils daily as needed (when working).     . triamcinolone cream (KENALOG) 0.1 % Apply 1 application topically 2 (two) times daily. (Patient taking differently: Apply 1 application topically daily as needed (discoloration). ) 45 g 2   . valACYclovir (VALTREX) 500 MG tablet TAKE 1 TABLET BY MOUTH TWICE DAILY (Patient taking differently: Take 500 mg by mouth 2 (two) times daily as needed (outbreaks). ) 6 tablet 6   . VENTOLIN HFA 108 (90 Base) MCG/ACT inhaler INHALE 2 PUFFS INTO THE LUNGS EVERY 6 HOURS AS NEEDED FOR WHEEZING (Patient taking differently: Inhale 2 puffs into the lungs every 6 (six) hours as needed for wheezing. ) 18 g 3   . sildenafil (VIAGRA) 100 MG tablet TAKE 1 TABLET BY MOUTH ONE HOURS PRIOR TO INTERCOURSE (Patient not taking: Reported on 10/30/2020) 10 tablet 6 Not Taking at Unknown time  . torsemide (DEMADEX) 20 MG tablet One to two tabs qam prn edema (Patient not taking: Reported on 10/30/2020) 60 tablet 4 Not Taking at Unknown time   Facility-Administered Medications Ordered in Other Encounters  Medication Dose Route Frequency Provider Last Rate Last Admin  . fentaNYL (SUBLIMAZE) injection 25-50 mcg  25-50 mcg Intravenous Q5 min PRN Myrtie Soman, MD      . promethazine (PHENERGAN) injection 6.25-12.5 mg  6.25-12.5 mg Intravenous Q15 min PRN Myrtie Soman, MD       Allergies  Allergen Reactions  . Bee Venom Anaphylaxis  . Celebrex [Celecoxib] Diarrhea  . Hctz [Hydrochlorothiazide]      Dizziness and sycope  . Lozol [Indapamide]     Dizziness and syncope    Social History   Tobacco Use  . Smoking status: Former Smoker    Packs/day: 1.00    Types: Cigarettes    Quit date: 10/20/2011    Years since quitting: 9.0  . Smokeless tobacco: Never Used  Substance Use Topics  . Alcohol use: Yes    Alcohol/week: 0.0 standard drinks    Comment: occ    Family History  Problem Relation Age of Onset  . Colon cancer Brother 50       agent orange  . Heart disease Mother   . Cancer Father      Review of Systems  Musculoskeletal: Positive for arthralgias.       Left hip  All other systems reviewed and are negative.  Objective:  Physical Exam Constitutional:      Appearance: Normal appearance.  HENT:     Head: Normocephalic and atraumatic.     Mouth/Throat:     Pharynx: Oropharynx is clear.  Eyes:     Extraocular Movements: Extraocular movements intact.  Cardiovascular:     Rate and Rhythm: Normal rate and regular rhythm.     Pulses: Normal pulses.  Pulmonary:     Effort: Pulmonary effort is normal.  Abdominal:     Palpations: Abdomen is soft.  Musculoskeletal:     Cervical back: Normal range of motion.     Comments: Left hip motion is limited and extremely painful in internal rotation.  Sensation and motor function are intact distally.  He has palpable pulses in his feet.  He does have some central obesity with a BMI of 38.    Skin:    General: Skin is warm and dry.  Neurological:     General: No focal deficit present.     Mental Status: He is alert and oriented to person, place, and time.  Psychiatric:        Mood and Affect: Mood normal.        Behavior: Behavior normal.        Thought Content: Thought content normal.        Judgment: Judgment normal.     Vital signs in last 24 hours:    Labs:   Estimated body mass index is 35.36 kg/m as calculated from the following:   Height as of 11/01/20: 6\' 1"  (1.854 m).   Weight as of 11/01/20: 121.6  kg.   Imaging Review Plain radiographs demonstrate severe degenerative joint disease of the left hip(s). The bone quality appears to be good for age and reported activity level.      Assessment/Plan:  End stage primary arthritis, left hip(s)  The patient history, physical examination, clinical judgement of the provider and imaging studies are consistent with end stage degenerative joint disease of the left hip(s) and total hip arthroplasty is deemed medically necessary. The treatment options including medical management, injection therapy, arthroscopy and arthroplasty were discussed at length. The risks and benefits of total hip arthroplasty were presented and reviewed. The risks due to aseptic loosening, infection, stiffness, dislocation/subluxation,  thromboembolic complications and other imponderables were discussed.  The patient acknowledged the explanation, agreed to proceed with the plan and consent was signed. Patient is being admitted for inpatient treatment for surgery, pain control, PT, OT, prophylactic antibiotics, VTE prophylaxis, progressive ambulation and ADL's and discharge planning.The patient is planning to be discharged home with home health services

## 2020-11-13 ENCOUNTER — Ambulatory Visit (HOSPITAL_COMMUNITY): Payer: BC Managed Care – PPO | Admitting: Anesthesiology

## 2020-11-13 ENCOUNTER — Encounter (HOSPITAL_COMMUNITY)
Admission: RE | Disposition: A | Discharge status: Home / Self Care | Payer: Self-pay | Source: Home / Self Care | Attending: Orthopaedic Surgery

## 2020-11-13 ENCOUNTER — Encounter (HOSPITAL_COMMUNITY): Payer: Self-pay | Admitting: Orthopaedic Surgery

## 2020-11-13 ENCOUNTER — Ambulatory Visit (HOSPITAL_COMMUNITY): Payer: BC Managed Care – PPO

## 2020-11-13 ENCOUNTER — Ambulatory Visit (HOSPITAL_COMMUNITY)
Admission: RE | Admit: 2020-11-13 | Discharge: 2020-11-13 | Disposition: A | Discharge status: Home / Self Care | Payer: BC Managed Care – PPO | Attending: Orthopaedic Surgery | Admitting: Orthopaedic Surgery

## 2020-11-13 DIAGNOSIS — Z419 Encounter for procedure for purposes other than remedying health state, unspecified: Secondary | ICD-10-CM

## 2020-11-13 DIAGNOSIS — Z886 Allergy status to analgesic agent status: Secondary | ICD-10-CM | POA: Diagnosis not present

## 2020-11-13 DIAGNOSIS — Z87891 Personal history of nicotine dependence: Secondary | ICD-10-CM | POA: Diagnosis not present

## 2020-11-13 DIAGNOSIS — Z79899 Other long term (current) drug therapy: Secondary | ICD-10-CM | POA: Diagnosis not present

## 2020-11-13 DIAGNOSIS — Z888 Allergy status to other drugs, medicaments and biological substances status: Secondary | ICD-10-CM | POA: Diagnosis not present

## 2020-11-13 DIAGNOSIS — M1612 Unilateral primary osteoarthritis, left hip: Secondary | ICD-10-CM | POA: Insufficient documentation

## 2020-11-13 HISTORY — PX: TOTAL HIP ARTHROPLASTY: SHX124

## 2020-11-13 LAB — ABO/RH: ABO/RH(D): A POS

## 2020-11-13 SURGERY — ARTHROPLASTY, HIP, TOTAL, ANTERIOR APPROACH
Anesthesia: Monitor Anesthesia Care | Site: Hip | Laterality: Left

## 2020-11-13 MED ORDER — CHLORHEXIDINE GLUCONATE 0.12 % MT SOLN
15.0000 mL | Freq: Once | OROMUCOSAL | Status: AC
  Administered 2020-11-13: 15 mL via OROMUCOSAL

## 2020-11-13 MED ORDER — MIDAZOLAM HCL 2 MG/2ML IJ SOLN
INTRAMUSCULAR | Status: DC | PRN
  Administered 2020-11-13: 2 mg via INTRAVENOUS

## 2020-11-13 MED ORDER — METHOCARBAMOL 500 MG PO TABS: 500.0000 mg | ORAL_TABLET | Freq: Four times a day (QID) | ORAL | Status: DC | PRN

## 2020-11-13 MED ORDER — BUPIVACAINE IN DEXTROSE 0.75-8.25 % IT SOLN
INTRATHECAL | Status: DC | PRN
  Administered 2020-11-13: 1.8 mL via INTRATHECAL

## 2020-11-13 MED ORDER — BUPIVACAINE-EPINEPHRINE (PF) 0.25% -1:200000 IJ SOLN
INTRAMUSCULAR | Status: AC
  Filled 2020-11-13: qty 30

## 2020-11-13 MED ORDER — PHENYLEPHRINE 40 MCG/ML (10ML) SYRINGE FOR IV PUSH (FOR BLOOD PRESSURE SUPPORT)
PREFILLED_SYRINGE | INTRAVENOUS | Status: AC
  Filled 2020-11-13: qty 10

## 2020-11-13 MED ORDER — HYDROCODONE-ACETAMINOPHEN 5-325 MG PO TABS
1.0000 | ORAL_TABLET | ORAL | Status: DC | PRN
  Administered 2020-11-13: 2 via ORAL

## 2020-11-13 MED ORDER — ONDANSETRON HCL 4 MG/2ML IJ SOLN
INTRAMUSCULAR | Status: AC
  Filled 2020-11-13: qty 2

## 2020-11-13 MED ORDER — MIDAZOLAM HCL 2 MG/2ML IJ SOLN
INTRAMUSCULAR | Status: AC
  Filled 2020-11-13: qty 2

## 2020-11-13 MED ORDER — HYDROCODONE-ACETAMINOPHEN 5-325 MG PO TABS
ORAL_TABLET | ORAL | Status: AC
  Filled 2020-11-13: qty 2

## 2020-11-13 MED ORDER — LACTATED RINGERS IV BOLUS
250.0000 mL | Freq: Once | INTRAVENOUS | Status: AC
  Administered 2020-11-13: 250 mL via INTRAVENOUS

## 2020-11-13 MED ORDER — DEXAMETHASONE SODIUM PHOSPHATE 10 MG/ML IJ SOLN
INTRAMUSCULAR | Status: DC | PRN
  Administered 2020-11-13: 5 mg via INTRAVENOUS

## 2020-11-13 MED ORDER — METHOCARBAMOL 500 MG IVPB - SIMPLE MED: 500.0000 mg | Freq: Four times a day (QID) | INTRAVENOUS | Status: DC | PRN

## 2020-11-13 MED ORDER — FENTANYL CITRATE (PF) 100 MCG/2ML IJ SOLN
INTRAMUSCULAR | Status: DC | PRN
Start: 2020-11-13 — End: 2020-11-13
  Administered 2020-11-13 (×2): 25 ug via INTRAVENOUS

## 2020-11-13 MED ORDER — LIDOCAINE HCL (CARDIAC) PF 100 MG/5ML IV SOSY
PREFILLED_SYRINGE | INTRAVENOUS | Status: DC | PRN
  Administered 2020-11-13: 50 mg via INTRAVENOUS

## 2020-11-13 MED ORDER — TRANEXAMIC ACID-NACL 1000-0.7 MG/100ML-% IV SOLN
INTRAVENOUS | Status: AC
  Filled 2020-11-13: qty 100

## 2020-11-13 MED ORDER — POVIDONE-IODINE 10 % EX SWAB
2.0000 "application " | Freq: Once | CUTANEOUS | Status: AC
  Administered 2020-11-13: 2 via TOPICAL

## 2020-11-13 MED ORDER — LIDOCAINE HCL (PF) 2 % IJ SOLN
INTRAMUSCULAR | Status: AC
  Filled 2020-11-13: qty 5

## 2020-11-13 MED ORDER — PROPOFOL 500 MG/50ML IV EMUL
INTRAVENOUS | Status: AC
  Filled 2020-11-13: qty 50

## 2020-11-13 MED ORDER — HYDROCODONE-ACETAMINOPHEN 5-325 MG PO TABS
1.0000 | ORAL_TABLET | Freq: Four times a day (QID) | ORAL | 0 refills | Status: DC | PRN
Start: 2020-11-13 — End: 2021-06-18

## 2020-11-13 MED ORDER — LACTATED RINGERS IV BOLUS
500.0000 mL | Freq: Once | INTRAVENOUS | Status: AC
  Administered 2020-11-13: 500 mL via INTRAVENOUS

## 2020-11-13 MED ORDER — BUPIVACAINE-EPINEPHRINE (PF) 0.25% -1:200000 IJ SOLN
INTRAMUSCULAR | Status: DC | PRN
  Administered 2020-11-13: 30 mL

## 2020-11-13 MED ORDER — CEFAZOLIN SODIUM-DEXTROSE 2-4 GM/100ML-% IV SOLN: 2.0000 g | Freq: Four times a day (QID) | INTRAVENOUS | Status: DC

## 2020-11-13 MED ORDER — ORAL CARE MOUTH RINSE: 15.0000 mL | Freq: Once | OROMUCOSAL | Status: AC

## 2020-11-13 MED ORDER — TRANEXAMIC ACID-NACL 1000-0.7 MG/100ML-% IV SOLN
1000.0000 mg | Freq: Once | INTRAVENOUS | Status: AC
  Administered 2020-11-13: 1000 mg via INTRAVENOUS

## 2020-11-13 MED ORDER — PROPOFOL 1000 MG/100ML IV EMUL
INTRAVENOUS | Status: AC
  Filled 2020-11-13: qty 100

## 2020-11-13 MED ORDER — STERILE WATER FOR IRRIGATION IR SOLN
Status: DC | PRN
  Administered 2020-11-13: 2000 mL

## 2020-11-13 MED ORDER — LACTATED RINGERS IV SOLN
INTRAVENOUS | Status: DC
  Administered 2020-11-13: 1000 mL via INTRAVENOUS

## 2020-11-13 MED ORDER — TRANEXAMIC ACID-NACL 1000-0.7 MG/100ML-% IV SOLN
1000.0000 mg | INTRAVENOUS | Status: AC
  Administered 2020-11-13: 1000 mg via INTRAVENOUS
  Filled 2020-11-13: qty 100

## 2020-11-13 MED ORDER — ONDANSETRON HCL 4 MG/2ML IJ SOLN
INTRAMUSCULAR | Status: DC | PRN
  Administered 2020-11-13: 4 mg via INTRAVENOUS

## 2020-11-13 MED ORDER — ASPIRIN EC 81 MG PO TBEC: 81.0000 mg | DELAYED_RELEASE_TABLET | Freq: Two times a day (BID) | ORAL | 0 refills | Status: DC

## 2020-11-13 MED ORDER — PHENYLEPHRINE 40 MCG/ML (10ML) SYRINGE FOR IV PUSH (FOR BLOOD PRESSURE SUPPORT)
PREFILLED_SYRINGE | INTRAVENOUS | Status: DC | PRN
  Administered 2020-11-13 (×2): 40 ug via INTRAVENOUS
  Administered 2020-11-13: 120 ug via INTRAVENOUS
  Administered 2020-11-13: 80 ug via INTRAVENOUS
  Administered 2020-11-13: 40 ug via INTRAVENOUS
  Administered 2020-11-13: 80 ug via INTRAVENOUS

## 2020-11-13 MED ORDER — TIZANIDINE HCL 4 MG PO TABS: 4.0000 mg | ORAL_TABLET | Freq: Four times a day (QID) | ORAL | 1 refills | Status: DC | PRN

## 2020-11-13 MED ORDER — LACTATED RINGERS IV SOLN: INTRAVENOUS | Status: DC

## 2020-11-13 MED ORDER — PROPOFOL 10 MG/ML IV BOLUS
INTRAVENOUS | Status: AC
  Filled 2020-11-13: qty 20

## 2020-11-13 MED ORDER — PROPOFOL 500 MG/50ML IV EMUL
INTRAVENOUS | Status: DC | PRN
  Administered 2020-11-13: 75 ug/kg/min via INTRAVENOUS

## 2020-11-13 MED ORDER — TRANEXAMIC ACID 1000 MG/10ML IV SOLN
INTRAVENOUS | Status: DC | PRN
  Administered 2020-11-13: 2000 mg via TOPICAL

## 2020-11-13 MED ORDER — LACTATED RINGERS IV BOLUS: 250.0000 mL | Freq: Once | INTRAVENOUS | Status: DC

## 2020-11-13 MED ORDER — 0.9 % SODIUM CHLORIDE (POUR BTL) OPTIME
TOPICAL | Status: DC | PRN
  Administered 2020-11-13: 1000 mL

## 2020-11-13 MED ORDER — FENTANYL CITRATE (PF) 100 MCG/2ML IJ SOLN
INTRAMUSCULAR | Status: AC
  Filled 2020-11-13: qty 2

## 2020-11-13 MED ORDER — FENTANYL CITRATE (PF) 100 MCG/2ML IJ SOLN: 25.0000 ug | INTRAMUSCULAR | Status: DC | PRN

## 2020-11-13 MED ORDER — PROPOFOL 10 MG/ML IV BOLUS
INTRAVENOUS | Status: DC | PRN
  Administered 2020-11-13 (×3): 20 mg via INTRAVENOUS
  Administered 2020-11-13: 10 mg via INTRAVENOUS

## 2020-11-13 MED ORDER — DEXAMETHASONE SODIUM PHOSPHATE 10 MG/ML IJ SOLN
INTRAMUSCULAR | Status: AC
  Filled 2020-11-13: qty 1

## 2020-11-13 MED ORDER — ACETAMINOPHEN 500 MG PO TABS
1000.0000 mg | ORAL_TABLET | Freq: Once | ORAL | Status: AC
  Administered 2020-11-13: 1000 mg via ORAL
  Filled 2020-11-13: qty 2

## 2020-11-13 MED ORDER — BUPIVACAINE LIPOSOME 1.3 % IJ SUSP
INTRAMUSCULAR | Status: DC | PRN
  Administered 2020-11-13: 10 mL

## 2020-11-13 MED ORDER — AMISULPRIDE (ANTIEMETIC) 5 MG/2ML IV SOLN: 10.0000 mg | Freq: Once | INTRAVENOUS | Status: DC | PRN

## 2020-11-13 SURGICAL SUPPLY — 45 items
BAG DECANTER FOR FLEXI CONT (MISCELLANEOUS) ×2 IMPLANT
BLADE SAW SGTL 18X1.27X75 (BLADE) ×2 IMPLANT
BOOTIES KNEE HIGH SLOAN (MISCELLANEOUS) ×2 IMPLANT
CELLS DAT CNTRL 66122 CELL SVR (MISCELLANEOUS) ×1 IMPLANT
COVER PERINEAL POST (MISCELLANEOUS) ×2 IMPLANT
COVER SURGICAL LIGHT HANDLE (MISCELLANEOUS) ×2 IMPLANT
COVER WAND RF STERILE (DRAPES) IMPLANT
CUP ACET GRIPTION SERIS 56 100 (Trauma) ×1 IMPLANT
DECANTER SPIKE VIAL GLASS SM (MISCELLANEOUS) ×2 IMPLANT
DRAPE IMP U-DRAPE 54X76 (DRAPES) ×2 IMPLANT
DRAPE ORTHO SPLIT 77X108 STRL (DRAPES)
DRAPE STERI IOBAN 125X83 (DRAPES) ×2 IMPLANT
DRAPE SURG ORHT 6 SPLT 77X108 (DRAPES) IMPLANT
DRAPE U-SHAPE 47X51 STRL (DRAPES) ×4 IMPLANT
DRSG AQUACEL AG ADV 3.5X 6 (GAUZE/BANDAGES/DRESSINGS) ×2 IMPLANT
DURAPREP 26ML APPLICATOR (WOUND CARE) ×2 IMPLANT
ELECT BLADE TIP CTD 4 INCH (ELECTRODE) ×2 IMPLANT
ELECT REM PT RETURN 15FT ADLT (MISCELLANEOUS) ×2 IMPLANT
ELIMINATOR HOLE APEX DEPUY (Hips) ×2 IMPLANT
GIPTION SERIES 56 100 (Trauma) ×2 IMPLANT
GLOVE BIO SURGEON STRL SZ8 (GLOVE) ×4 IMPLANT
GLOVE BIOGEL PI IND STRL 8 (GLOVE) ×2 IMPLANT
GLOVE BIOGEL PI INDICATOR 8 (GLOVE) ×2
GOWN STRL REUS W/TWL XL LVL3 (GOWN DISPOSABLE) ×4 IMPLANT
HEAD M SROM 36MM 2 (Hips) ×1 IMPLANT
HOLDER FOLEY CATH W/STRAP (MISCELLANEOUS) ×2 IMPLANT
KIT TURNOVER KIT A (KITS) IMPLANT
MANIFOLD NEPTUNE II (INSTRUMENTS) ×2 IMPLANT
NEEDLE HYPO 22GX1.5 SAFETY (NEEDLE) ×2 IMPLANT
NS IRRIG 1000ML POUR BTL (IV SOLUTION) ×2 IMPLANT
PACK ANTERIOR HIP CUSTOM (KITS) ×2 IMPLANT
PENCIL SMOKE EVACUATOR (MISCELLANEOUS) IMPLANT
PINNACLE ALTRX PLUS 4 N 36X56 (Hips) ×2 IMPLANT
PROTECTOR NERVE ULNAR (MISCELLANEOUS) ×2 IMPLANT
RTRCTR WOUND ALEXIS 18CM MED (MISCELLANEOUS) ×2
SROM M HEAD 36MM 2 (Hips) ×2 IMPLANT
STEM FEMORAL SZ9 HIGH ACTIS (Stem) ×2 IMPLANT
SUT ETHIBOND NAB CT1 #1 30IN (SUTURE) ×4 IMPLANT
SUT VIC AB 1 CT1 36 (SUTURE) ×2 IMPLANT
SUT VIC AB 2-0 CT1 27 (SUTURE) ×1
SUT VIC AB 2-0 CT1 TAPERPNT 27 (SUTURE) ×1 IMPLANT
SUT VICRYL AB 3-0 FS1 BRD 27IN (SUTURE) ×2 IMPLANT
SUT VLOC 180 0 24IN GS25 (SUTURE) ×2 IMPLANT
SYR 50ML LL SCALE MARK (SYRINGE) ×2 IMPLANT
TRAY FOLEY MTR SLVR 16FR STAT (SET/KITS/TRAYS/PACK) ×2 IMPLANT

## 2020-11-13 NOTE — Transfer of Care (Signed)
Immediate Anesthesia Transfer of Care Note  Patient: Ronald Juarez  Procedure(s) Performed: TOTAL HIP ARTHROPLASTY ANTERIOR APPROACH (Left Hip)  Patient Location: PACU  Anesthesia Type:Spinal  Level of Consciousness: awake, alert , oriented, drowsy and patient cooperative  Airway & Oxygen Therapy: Patient Spontanous Breathing and Patient connected to face mask oxygen  Post-op Assessment: Report given to RN and Post -op Vital signs reviewed and stable  Post vital signs: Reviewed and stable  Last Vitals:  Vitals Value Taken Time  BP 93/60 11/13/20 0953  Temp    Pulse 60 11/13/20 0955  Resp 19 11/13/20 0955  SpO2 98 % 11/13/20 0955  Vitals shown include unvalidated device data.  Last Pain:  Vitals:   11/13/20 0643  TempSrc: Oral         Complications: No complications documented.

## 2020-11-13 NOTE — Anesthesia Postprocedure Evaluation (Signed)
Anesthesia Post Note  Patient: Ronald Juarez  Procedure(s) Performed: TOTAL HIP ARTHROPLASTY ANTERIOR APPROACH (Left Hip)     Patient location during evaluation: PACU Anesthesia Type: Spinal Level of consciousness: awake and alert Pain management: pain level controlled Vital Signs Assessment: post-procedure vital signs reviewed and stable Respiratory status: spontaneous breathing and respiratory function stable Cardiovascular status: blood pressure returned to baseline and stable Postop Assessment: spinal receding Anesthetic complications: no   No complications documented.  Last Vitals:  Vitals:   11/13/20 1200 11/13/20 1300  BP: 131/87 134/84  Pulse: 60 67  Resp: 18   Temp: 36.7 C   SpO2: 92% 96%    Last Pain:  Vitals:   11/13/20 1200  TempSrc:   PainSc: 6                  Tiajuana Amass

## 2020-11-13 NOTE — Anesthesia Procedure Notes (Signed)
Spinal  Patient location during procedure: OR Start time: 11/13/2020 7:36 AM End time: 11/13/2020 7:41 AM Staffing Performed: anesthesiologist  Anesthesiologist: Catalina Gravel, MD Preanesthetic Checklist Completed: patient identified, IV checked, risks and benefits discussed, surgical consent, monitors and equipment checked, pre-op evaluation and timeout performed Spinal Block Patient position: sitting Prep: DuraPrep and site prepped and draped Patient monitoring: continuous pulse ox and blood pressure Approach: midline Location: L3-4 Injection technique: single-shot Needle Needle type: Pencan  Needle gauge: 24 G Additional Notes Functioning IV was confirmed and monitors were applied. Sterile prep and drape, including hand hygiene, mask and sterile gloves were used. The patient was positioned and the spine was prepped. The skin was anesthetized with lidocaine.  Free flow of clear CSF was obtained prior to injecting local anesthetic into the CSF.  The spinal needle aspirated freely following injection.  The needle was carefully withdrawn.  The patient tolerated the procedure well. Consent was obtained prior to procedure with all questions answered and concerns addressed. Risks including but not limited to bleeding, infection, nerve damage, paralysis, failed block, inadequate analgesia, allergic reaction, high spinal, itching and headache were discussed and the patient wished to proceed.   Attempt x1 by CRNA, attempt x1 by MDA with CSF return.  Hoy Morn, MD

## 2020-11-13 NOTE — Evaluation (Signed)
Physical Therapy Evaluation Patient Details Name: Ronald Juarez MRN: [id number] DOB: 1956-04-13 Today's Date: 11/13/2020   History of Present Illness  Pt is 64 yo male s/p L anterior THA on 11/13/20.  Clinical Impression  Pt is s/p L anterior THA resulting in the deficits listed below (see PT Problem List). Pt seen in PACU for possible same day discharge.  He was able to transfer, ambulate, and perform stairs with min guard.  Demonstrates good safety and has good support at home.  Pt reports some pain but that is significantly improved compared to prior to surgery and is very pleased and excited.  He was educated on safe transfer techniques and HEP.  Pt demonstrates mobility necessary to return home from therapy perspective, but will benefit from continued therapy to progress if he remains hospitalized.  Pt has RW but no BSC and is 6'1" tall - Would benefit from [facility] for easier toilet transfers at d/c.  Pt will benefit from skilled PT to increase their independence and safety with mobility to allow discharge to the venue listed below.     Follow Up Recommendations Follow surgeon's recommendation for DC plan and follow-up therapies;Supervision/Assistance - 24 hour    Equipment Recommendations  3in1 (PT)    Recommendations for Other Services       Precautions / Restrictions Precautions Precautions: Fall Restrictions Other Position/Activity Restrictions: WBAT L leg      Mobility  Bed Mobility Overal bed mobility: Needs Assistance Bed Mobility: Supine to Sit     Supine to sit: Min guard     General bed mobility comments: Educated on transfer technqiue and pt utilized gait belt to assist L LE    Transfers Overall transfer level: Needs assistance Equipment used: Rolling walker (2 wheeled) Transfers: Sit to/from Stand Sit to Stand: Min guard         General transfer comment: Performed 3 x throughout session with cues for hand placement.  Performed from elevated and normal  height bed. Demonstraated safe technique  Ambulation/Gait Ambulation/Gait assistance: Counsellor (Feet): 150 Feet Assistive device: Rolling walker (2 wheeled) Gait Pattern/deviations: Decreased stride length;Decreased stance time - left;Decreased weight shift to left;Step-to pattern     General Gait Details: mild circumduction on L; Cues for RW use and proximity; steady gait with decreased speed  Stairs Stairs: Yes Stairs assistance: Min guard Stair Management: Two rails;Step to pattern;Forwards Number of Stairs: 3    Wheelchair Mobility    Modified Rankin (Stroke Patients Only)       Balance Overall balance assessment: Needs assistance Sitting-balance support: No upper extremity supported Sitting balance-Leahy Scale: Normal     Standing balance support: Bilateral upper extremity supported;No upper extremity supported Standing balance-Leahy Scale: Fair Standing balance comment: Utilized RW for walking and could static stand without AD                             Pertinent Vitals/Pain Pain Assessment: 0-10 Pain Score:  (Reports significantly better than prior to sx) Pain Location: L hip Pain Descriptors / Indicators: Discomfort;Sore Pain Intervention(s): Limited activity within patient's tolerance;Monitored during session;Premedicated before session;Repositioned    Home Living Family/patient expects to be discharged to:: Private residence Living Arrangements: Other (Comment);Alone (multiple family members to assist) Available Help at Discharge:  (lives alone but plans to have family stay for the first few days) Type of Home: House Home Access: Stairs to enter Entrance Stairs-Rails: Left;Right;Can reach both Technical brewer  of Steps: 3 Home Layout: One level Home Equipment: Grab bars - tub/shower;Walker - 2 wheels Additional Comments: has access to shower seat and bsc    Prior Function Level of Independence: Independent          Comments: Pt completely independent and working     Hand Dominance        Extremity/Trunk Assessment   Upper Extremity Assessment Upper Extremity Assessment: Overall WFL for tasks assessed    Lower Extremity Assessment Lower Extremity Assessment: LLE deficits/detail;RLE deficits/detail RLE Deficits / Details: WNL LLE Deficits / Details: ROM: WFL, MMT: ankle 5/5, knee at least 3/5 not further tested, hip 2/5    Cervical / Trunk Assessment Cervical / Trunk Assessment: Normal  Communication   Communication: No difficulties  Cognition Arousal/Alertness: Awake/alert Behavior During Therapy: WFL for tasks assessed/performed Overall Cognitive Status: Within Functional Limits for tasks assessed                                        General Comments General comments (skin integrity, edema, etc.): Educated on PT role, POC, HEP, safe ice use, and need for assist for first few days. For HEP discussed importance of walking frequently and exericses as tolerated.    Exercises Total Joint Exercises Ankle Circles/Pumps: AROM;Both;Supine;10 reps Quad Sets: AROM;Supine;Both;10 reps Heel Slides: AAROM;Left;5 reps;Supine (AAROM with gait belt) Hip ABduction/ADduction: AAROM;Left;5 reps;Supine Long Arc Quad: AROM;Left;10 reps;Seated   Assessment/Plan    PT Assessment Patient needs continued PT services  PT Problem List Decreased strength;Decreased mobility;Decreased safety awareness;Decreased range of motion;Decreased knowledge of precautions;Decreased activity tolerance;Decreased balance;Decreased knowledge of use of DME;Pain       PT Treatment Interventions DME instruction;Therapeutic activities;Modalities;Gait training;Therapeutic exercise;Patient/family education;Stair training;Balance training;Functional mobility training    PT Goals (Current goals can be found in the Care Plan section)  Acute Rehab PT Goals Patient Stated Goal: return home PT Goal Formulation:  With patient Time For Goal Achievement: 11/27/20 Potential to Achieve Goals: Good    Frequency 7X/week   Barriers to discharge        Co-evaluation               AM-PAC PT "6 Clicks" Mobility  Outcome Measure Help needed turning from your back to your side while in a flat bed without using bedrails?: None Help needed moving from lying on your back to sitting on the side of a flat bed without using bedrails?: A Little Help needed moving to and from a bed to a chair (including a wheelchair)?: None Help needed standing up from a chair using your arms (e.g., wheelchair or bedside chair)?: None Help needed to walk in hospital room?: None Help needed climbing 3-5 steps with a railing? : A Little 6 Click Score: 22    End of Session Equipment Utilized During Treatment: Gait belt Activity Tolerance: Patient tolerated treatment well Patient left: in bed;with call bell/phone within reach Nurse Communication: Mobility status (cleared PT) PT Visit Diagnosis: Other abnormalities of gait and mobility (R26.89);Muscle weakness (generalized) (M62.81)    Time: 4825-0037 PT Time Calculation (min) (ACUTE ONLY): 35 min   Charges:   PT Evaluation $PT Eval Moderate Complexity: 1 Mod PT Treatments $Therapeutic Exercise: 8-22 mins        Abran Richard, PT Acute Rehab Services Pager [phone] [facility] Re[facility] [phone]    Karlton Lemon 11/13/2020, 2:04 PM

## 2020-11-13 NOTE — Op Note (Signed)
PRE-OP DIAGNOSIS:  LEFT HIP DEGENERATIVE JOINT DISEASE POST-OP DIAGNOSIS: same PROCEDURE:  LEFT TOTAL HIP ARTHROPLASTY ANTERIOR APPROACH ANESTHESIA:  Spinal and MAC SURGEON:  Melrose Nakayama MD ASSISTANT:  Loni Dolly PA-C   INDICATIONS FOR PROCEDURE:  The patient is a 64 y.o. male with a long history of a painful hip.  This has persisted despite multiple conservative measures.  The patient has persisted with pain and dysfunction making rest and activity difficult.  A total hip replacement is offered as surgical treatment.  Informed operative consent was obtained after discussion of possible complications including reaction to anesthesia, infection, neurovascular injury, dislocation, DVT, PE, and death.  The importance of the postoperative rehab program to optimize result was stressed with the patient.  SUMMARY OF FINDINGS AND PROCEDURE:  Under the above anesthesia through a anterior approach an the Hana table a left THR was performed.  The patient had severe degenerative change and excellent bone quality.  We used DePuy components to replace the hip and these were size 9 high offset Actis femur capped with a _2 71m cobalt chrome hip ball.  On the acetabular side we used a size 56 Gription shell with a plus 4 neutral polyethylene liner.  We did use a hole eliminator.  ALoni DollyPA-C assisted throughout and was invaluable to the completion of the case in that he helped position and retract while I performed the procedure.  He also closed simultaneously to help minimize OR time.  I used fluoroscopy throughout the case to check position of components and leg lengths and read all these views myself.  DESCRIPTION OF PROCEDURE:  The patient was taken to the OR suite where the above anesthetic was applied.  The patient was then positioned on the Hana table supine.  All bony prominences were appropriately padded.  Prep and drape was then performed in normal sterile fashion.  The patient was given kefzol  preoperative antibiotic and an appropriate time out was performed.  We then took an anterior approach to the left hip.  Dissection was taken through adipose to the tensor fascia lata fascia.  This structure was incised longitudinally and we dissected in the intermuscular interval just medial to this muscle.  Cobra retractors were placed superior and inferior to the femoral neck superficial to the capsule.  A capsular incision was then made and the retractors were placed along the femoral neck.  Xray was brought in to get a good level for the femoral neck cut which was made with an oscillating saw and osteotome.  The femoral head was removed with a corkscrew.  The acetabulum was exposed and some labral tissues were excised. Reaming was taken to the inside wall of the pelvis and sequentially up to 1 mm smaller than the actual component.  A trial of components was done and then the aforementioned acetabular shell was placed in appropriate tilt and anteversion confirmed by fluoroscopy. The liner was placed along with the hole eliminator and attention was turned to the femur.  The leg was brought down and over into adduction and the elevator bar was used to raise the femur up gently in the wound.  The piriformis was released with care taken to preserve the obturator internus attachment and all of the posterior capsule. The femur was reamed and then broached to the appropriate size.  A trial reduction was done and the aforementioned head and neck assembly gave uKoreathe best stability in extension with external rotation.  Leg lengths were felt to be about  equal by fluoroscopic exam.  The trial components were removed and the wound irrigated.  We then placed the femoral component in appropriate anteversion.  The head was applied to a dry stem neck and the hip again reduced.  It was again stable in the aforementioned position.  The would was irrigated again followed by re-approximation of anterior capsule with ethibond suture.  Tensor fascia was repaired with V-loc suture  followed by deep closure with #O and #2 undyed vicryl.  Skin was closed with subQ stitch and steristrips followed by a sterile dressing.  EBL and IOF can be obtained from anesthesia records.  DISPOSITION:  The patient was extubated in the OR and taken to PACU in stable condition to be admitted to the Orthopedic Surgery for appropriate post-op care to include perioperative antibiotics and DVT prophylaxis.

## 2020-11-13 NOTE — Interval H&P Note (Signed)
History and Physical Interval Note:  11/13/2020 7:27 AM  Ronald Juarez  has presented today for surgery, with the diagnosis of LEFT HIP DEGENERATIVE JOINT DISEASE.  The various methods of treatment have been discussed with the patient and family. After consideration of risks, benefits and other options for treatment, the patient has consented to  Procedure(s): TOTAL HIP ARTHROPLASTY ANTERIOR APPROACH (Left) as a surgical intervention.  The patient's history has been reviewed, patient examined, no change in status, stable for surgery.  I have reviewed the patient's chart and labs.  Questions were answered to the patient's satisfaction.     Hessie Dibble

## 2020-11-19 ENCOUNTER — Encounter (HOSPITAL_COMMUNITY): Payer: Self-pay | Admitting: Orthopaedic Surgery

## 2020-12-16 ENCOUNTER — Other Ambulatory Visit: Payer: Self-pay | Admitting: Family Medicine

## 2020-12-18 ENCOUNTER — Encounter: Payer: Self-pay | Admitting: Family Medicine

## 2020-12-18 ENCOUNTER — Other Ambulatory Visit: Payer: Self-pay

## 2020-12-18 ENCOUNTER — Ambulatory Visit: Payer: BC Managed Care – PPO | Admitting: Family Medicine

## 2020-12-18 VITALS — BP 136/84 | HR 92 | Temp 97.2°F | Ht 72.0 in | Wt 279.6 lb

## 2020-12-18 DIAGNOSIS — I10 Essential (primary) hypertension: Secondary | ICD-10-CM | POA: Diagnosis not present

## 2020-12-18 DIAGNOSIS — Z96642 Presence of left artificial hip joint: Secondary | ICD-10-CM | POA: Diagnosis not present

## 2020-12-18 DIAGNOSIS — C4359 Malignant melanoma of other part of trunk: Secondary | ICD-10-CM | POA: Diagnosis not present

## 2020-12-18 DIAGNOSIS — I878 Other specified disorders of veins: Secondary | ICD-10-CM

## 2020-12-18 DIAGNOSIS — G4700 Insomnia, unspecified: Secondary | ICD-10-CM | POA: Insufficient documentation

## 2020-12-18 DIAGNOSIS — F411 Generalized anxiety disorder: Secondary | ICD-10-CM

## 2020-12-18 MED ORDER — ALPRAZOLAM 0.5 MG PO TABS: 0.2500 mg | ORAL_TABLET | Freq: Every evening | ORAL | 2 refills | Status: DC | PRN

## 2020-12-18 MED ORDER — POTASSIUM CHLORIDE CRYS ER 20 MEQ PO TBCR: 20.0000 meq | EXTENDED_RELEASE_TABLET | Freq: Every day | ORAL | 1 refills | Status: DC

## 2020-12-18 MED ORDER — FUROSEMIDE 20 MG PO TABS: 20.0000 mg | ORAL_TABLET | ORAL | 1 refills | Status: DC

## 2020-12-18 MED ORDER — AMLODIPINE BESY-BENAZEPRIL HCL 5-20 MG PO CAPS: 1.0000 | ORAL_CAPSULE | Freq: Every day | ORAL | 1 refills | Status: DC

## 2020-12-18 MED ORDER — BUPROPION HCL ER (SR) 150 MG PO TB12: 150.0000 mg | ORAL_TABLET | Freq: Two times a day (BID) | ORAL | 1 refills | Status: DC

## 2020-12-18 NOTE — Progress Notes (Signed)
Patient ID: Ronald Juarez, male    DOB: 1956-10-11, 65 y.o.   MRN: [id number]   Chief Complaint  Patient presents with  . Hypertension    Follow up   Subjective:    HPI CC- htn  Pt hd left hip surgery, total hip replacement.  And feeling better.  Using a cane. Has been doing the PT.  Seeing Dr. Rhona Raider ortho. Surgery 11/13/20.  Doing better after surgery to remove melanoma on rt chest and 3 LNs.  Small are on the left forearm with small. Only needed to see derm yearly for f/u skin checks.  Insomnia- longstanding since a child.  Pt too groggy with the trazodone. Pt used to take xanax 0.5mg  taking 1/2-1 tab prn qhs. Was on this from2 [phone], we dc and started pt on trazodone. Pt feeling the xanax worked better for him.   HTN Pt compliant with BP meds.  No SEs Denies chest pain, sob, LE swelling, or blurry vision.   Medical History Ronald Juarez has a past medical history of Allergic rhinitis, Anxiety, Arthritis, Cancer ([place]), COPD (chronic obstructive pulmonary disease) ([place]), Depression, ED (erectile dysfunction), Finger laceration involving tendon, Hypertension, Insomnia, PNA (pneumonia), Reactive airways dysfunction syndrome ([place]), Stress, and Venous stasis.   Outpatient Encounter Medications as of 12/18/2020  Medication Sig  . acetaminophen (TYLENOL) 500 MG tablet Take 500-1,000 mg by mouth 2 (two) times daily as needed for moderate pain.  Marland Kitchen ALPRAZolam (XANAX) 0.5 MG tablet Take 0.5-1 tablets (0.25-0.5 mg total) by mouth at bedtime as needed.  Marland Kitchen amLODipine-benazepril (LOTREL) 5-20 MG capsule Take 1 capsule by mouth daily.  Marland Kitchen aspirin EC 81 MG tablet Take 1 tablet (81 mg total) by mouth 2 (two) times daily after a meal. Swallow whole.  Marland Kitchen buPROPion (WELLBUTRIN SR) 150 MG 12 hr tablet Take 1 tablet (150 mg total) by mouth 2 (two) times daily.  . fluticasone-salmeterol (ADVAIR HFA) 115-21 MCG/ACT inhaler Inhale 2 puffs into the lungs 2 (two) times daily as needed. (Patient taking  differently: Inhale 2 puffs into the lungs daily. )  . furosemide (LASIX) 20 MG tablet Take 1 tablet (20 mg total) by mouth See admin instructions. Take 20 mg once daily, may take an additional 20 mg dose as needed for swelling  . HYDROcodone-acetaminophen (NORCO/VICODIN) 5-325 MG tablet Take 1-2 tablets by mouth every 6 (six) hours as needed for moderate pain (post op pain). (Patient not taking: Reported on 12/18/2020)  . potassium chloride SA (KLOR-CON) 20 MEQ tablet Take 1 tablet (20 mEq total) by mouth daily.  . sildenafil (VIAGRA) 100 MG tablet TAKE 1 TABLET BY MOUTH ONE HOURS PRIOR TO INTERCOURSE (Patient not taking: Reported on 10/30/2020)  . Sodium Chloride-Xylitol (XLEAR SINUS CARE SPRAY NA) Place 2 sprays into both nostrils daily as needed (when working).  Marland Kitchen tiZANidine (ZANAFLEX) 4 MG tablet Take 1 tablet (4 mg total) by mouth every 6 (six) hours as needed for muscle spasms. (Patient not taking: Reported on 12/18/2020)  . torsemide (DEMADEX) 20 MG tablet One to two tabs qam prn edema (Patient not taking: Reported on 10/30/2020)  . triamcinolone cream (KENALOG) 0.1 % Apply 1 application topically 2 (two) times daily. (Patient taking differently: Apply 1 application topically daily as needed (discoloration). )  . valACYclovir (VALTREX) 500 MG tablet TAKE 1 TABLET BY MOUTH TWICE DAILY (Patient taking differently: Take 500 mg by mouth 2 (two) times daily as needed (outbreaks). )  . VENTOLIN HFA 108 (90 Base) MCG/ACT inhaler INHALE 2 PUFFS INTO  THE LUNGS EVERY 6 HOURS AS NEEDED FOR WHEEZING (Patient taking differently: Inhale 2 puffs into the lungs every 6 (six) hours as needed for wheezing. )  . [DISCONTINUED] amLODipine-benazepril (LOTREL) 5-20 MG capsule Take 1 capsule by mouth daily.  . [DISCONTINUED] buPROPion (WELLBUTRIN SR) 150 MG 12 hr tablet Take 1 tablet (150 mg total) by mouth 2 (two) times daily. (Patient taking differently: Take 300 mg by mouth daily. )  . [DISCONTINUED] furosemide (LASIX)  20 MG tablet Take 20 mg by mouth See admin instructions. Take 20 mg once daily, may take an additional 20 mg dose as needed for swelling  . [DISCONTINUED] potassium chloride SA (KLOR-CON) 20 MEQ tablet Take 1 tablet (20 mEq total) by mouth daily.   Facility-Administered Encounter Medications as of 12/18/2020  Medication  . fentaNYL (SUBLIMAZE) injection 25-50 mcg  . promethazine (PHENERGAN) injection 6.25-12.5 mg     Review of Systems  Constitutional: Negative for chills and fever.  HENT: Negative for congestion, rhinorrhea and sore throat.   Respiratory: Negative for cough, shortness of breath and wheezing.   Cardiovascular: Negative for chest pain and leg swelling.  Gastrointestinal: Negative for abdominal pain, diarrhea, nausea and vomiting.  Genitourinary: Negative for dysuria and frequency.  Musculoskeletal: Positive for arthralgias (h/o left hip surgery ).  Skin: Negative for rash.  Neurological: Negative for dizziness, weakness and headaches.  Psychiatric/Behavioral: Positive for sleep disturbance. Negative for dysphoric mood. The patient is not nervous/anxious.      Vitals BP 136/84   Pulse 92   Temp (!) 97.2 F (36.2 C) (Oral)   Ht 6' (1.829 m)   Wt 279 lb 9.6 oz (126.8 kg)   SpO2 98%   BMI 37.92 kg/m  pe Objective:   Physical Exam Vitals and nursing note reviewed.  Constitutional:      General: He is not in acute distress.    Appearance: Normal appearance. He is not ill-appearing.  HENT:     Head: Normocephalic.     Nose: Nose normal. No congestion.     Mouth/Throat:     Mouth: Mucous membranes are moist.     Pharynx: No oropharyngeal exudate.  Eyes:     Extraocular Movements: Extraocular movements intact.     Conjunctiva/sclera: Conjunctivae normal.     Pupils: Pupils are equal, round, and reactive to light.  Cardiovascular:     Rate and Rhythm: Normal rate and regular rhythm.     Pulses: Normal pulses.     Heart sounds: Normal heart sounds. No murmur  heard.   Pulmonary:     Effort: Pulmonary effort is normal. No respiratory distress.     Breath sounds: Normal breath sounds. No wheezing, rhonchi or rales.  Musculoskeletal:        General: Normal range of motion.     Right lower leg: No edema.     Left lower leg: No edema.     Comments: +left hip surgery, healing, using a cane.  Skin:    General: Skin is warm and dry.     Findings: No rash.  Neurological:     General: No focal deficit present.     Mental Status: He is alert and oriented to person, place, and time.     Cranial Nerves: No cranial nerve deficit.  Psychiatric:        Mood and Affect: Mood normal.        Behavior: Behavior normal.        Thought Content: Thought content normal.  Judgment: Judgment normal.     Assessment and Plan   1. Essential hypertension, benign - amLODipine-benazepril (LOTREL) 5-20 MG capsule; Take 1 capsule by mouth daily.  Dispense: 90 capsule; Refill: 1  2. Malignant melanoma of skin of chest (HCC)  3. Insomnia, unspecified type - ALPRAZolam (XANAX) 0.5 MG tablet; Take 0.5-1 tablets (0.25-0.5 mg total) by mouth at bedtime as needed.  Dispense: 30 tablet; Refill: 2  4. History of total hip replacement, left  5. Generalized anxiety disorder - buPROPion (WELLBUTRIN SR) 150 MG 12 hr tablet; Take 1 tablet (150 mg total) by mouth 2 (two) times daily.  Dispense: 180 tablet; Refill: 1  6. Venous stasis - furosemide (LASIX) 20 MG tablet; Take 1 tablet (20 mg total) by mouth See admin instructions. Take 20 mg once daily, may take an additional 20 mg dose as needed for swelling  Dispense: 90 tablet; Refill: 1 - potassium chloride SA (KLOR-CON) 20 MEQ tablet; Take 1 tablet (20 mEq total) by mouth daily.  Dispense: 90 tablet; Refill: 1   Melanoma- resected and in remission.  No further follow up needed except yearly skin check with derm.  Insomnia- not able to tolerate the trazodone. Pt wanting to restart the xanax prn for insomnia.  Discussed the concerns and safety issues with xanax and pt stating not taking daily and just prn.  htn-stable. Cont to watch salt intake. Cont meds.  F/u 65mo or prn.

## 2020-12-21 ENCOUNTER — Telehealth: Payer: Self-pay | Admitting: Family Medicine

## 2020-12-21 ENCOUNTER — Other Ambulatory Visit: Payer: Self-pay | Admitting: Family Medicine

## 2020-12-21 NOTE — Progress Notes (Signed)
Pt not longer on lasix, pt is on torsemide, pt wanting [place] to take it out of chart.   Dr. Lovena Le

## 2020-12-21 NOTE — Telephone Encounter (Signed)
Patient states wrong medication was sent in to [facility] Furosemide 20 mg he is no longer taking it was switched to torsemide 20 mg. He states that someone needs to let pharmacist know that he doesn't take that anymore. Please advise

## 2020-12-21 NOTE — Telephone Encounter (Signed)
Seen on 1/4 and pt states he is not taking furosemide he is now taking toresmide 2 qam prn. States he picked up furosemide from [facility] and paid for them but he will destroy them and wants to make sure [facility] know to cancel any refills. States he does not need toresmide at this time. Is it ok to remove furosemide from list and let pharm know to cancel.

## 2020-12-21 NOTE — Telephone Encounter (Signed)
Done, fixed. Dr. Lovena Le

## 2021-02-01 ENCOUNTER — Other Ambulatory Visit: Payer: Self-pay | Admitting: Family Medicine

## 2021-02-07 ENCOUNTER — Other Ambulatory Visit: Payer: Self-pay | Admitting: Family Medicine

## 2021-02-18 ENCOUNTER — Other Ambulatory Visit: Payer: Self-pay | Admitting: Family Medicine

## 2021-02-18 DIAGNOSIS — I10 Essential (primary) hypertension: Secondary | ICD-10-CM

## 2021-03-08 ENCOUNTER — Other Ambulatory Visit: Payer: Self-pay | Admitting: *Deleted

## 2021-03-11 MED ORDER — VALACYCLOVIR HCL 500 MG PO TABS: 500.0000 mg | ORAL_TABLET | Freq: Two times a day (BID) | ORAL | 6 refills | Status: DC

## 2021-03-25 ENCOUNTER — Other Ambulatory Visit: Payer: Self-pay | Admitting: Family Medicine

## 2021-03-25 DIAGNOSIS — I10 Essential (primary) hypertension: Secondary | ICD-10-CM

## 2021-04-22 ENCOUNTER — Other Ambulatory Visit: Payer: Self-pay | Admitting: Family Medicine

## 2021-04-22 DIAGNOSIS — I10 Essential (primary) hypertension: Secondary | ICD-10-CM

## 2021-05-01 ENCOUNTER — Ambulatory Visit: Payer: 59 | Attending: Critical Care Medicine

## 2021-05-01 DIAGNOSIS — Z20822 Contact with and (suspected) exposure to covid-19: Secondary | ICD-10-CM

## 2021-05-02 LAB — SARS-COV-2, NAA 2 DAY TAT

## 2021-05-02 LAB — NOVEL CORONAVIRUS, NAA: SARS-CoV-2, NAA: NOT DETECTED

## 2021-05-27 ENCOUNTER — Other Ambulatory Visit: Payer: Self-pay | Admitting: Family Medicine

## 2021-05-27 DIAGNOSIS — I10 Essential (primary) hypertension: Secondary | ICD-10-CM

## 2021-06-18 ENCOUNTER — Other Ambulatory Visit: Payer: Self-pay | Admitting: Family Medicine

## 2021-06-18 ENCOUNTER — Encounter: Payer: Self-pay | Admitting: Family Medicine

## 2021-06-18 ENCOUNTER — Other Ambulatory Visit: Payer: Self-pay

## 2021-06-18 ENCOUNTER — Ambulatory Visit: Payer: BC Managed Care – PPO | Admitting: Family Medicine

## 2021-06-18 VITALS — BP 105/61 | HR 82 | Temp 98.2°F | Ht 72.0 in | Wt 287.4 lb

## 2021-06-18 DIAGNOSIS — I878 Other specified disorders of veins: Secondary | ICD-10-CM

## 2021-06-18 DIAGNOSIS — F411 Generalized anxiety disorder: Secondary | ICD-10-CM

## 2021-06-18 DIAGNOSIS — I10 Essential (primary) hypertension: Secondary | ICD-10-CM

## 2021-06-18 DIAGNOSIS — J449 Chronic obstructive pulmonary disease, unspecified: Secondary | ICD-10-CM

## 2021-06-18 DIAGNOSIS — R7301 Impaired fasting glucose: Secondary | ICD-10-CM

## 2021-06-18 DIAGNOSIS — Z125 Encounter for screening for malignant neoplasm of prostate: Secondary | ICD-10-CM

## 2021-06-18 DIAGNOSIS — Z0001 Encounter for general adult medical examination with abnormal findings: Secondary | ICD-10-CM

## 2021-06-18 DIAGNOSIS — G4700 Insomnia, unspecified: Secondary | ICD-10-CM

## 2021-06-18 DIAGNOSIS — Z0000 Encounter for general adult medical examination without abnormal findings: Secondary | ICD-10-CM

## 2021-06-18 MED ORDER — AMLODIPINE BESY-BENAZEPRIL HCL 5-20 MG PO CAPS: 1.0000 | ORAL_CAPSULE | Freq: Every day | ORAL | 1 refills | Status: DC

## 2021-06-18 MED ORDER — TORSEMIDE 20 MG PO TABS: ORAL_TABLET | ORAL | 1 refills | Status: DC

## 2021-06-18 MED ORDER — BUPROPION HCL ER (SR) 150 MG PO TB12: 150.0000 mg | ORAL_TABLET | Freq: Two times a day (BID) | ORAL | 1 refills | Status: DC

## 2021-06-18 MED ORDER — POTASSIUM CHLORIDE CRYS ER 20 MEQ PO TBCR: 20.0000 meq | EXTENDED_RELEASE_TABLET | Freq: Every day | ORAL | 1 refills | Status: DC

## 2021-06-18 MED ORDER — VALACYCLOVIR HCL 500 MG PO TABS: 500.0000 mg | ORAL_TABLET | Freq: Two times a day (BID) | ORAL | 5 refills | Status: DC

## 2021-06-18 MED ORDER — SILDENAFIL CITRATE 100 MG PO TABS: ORAL_TABLET | ORAL | 6 refills | Status: DC

## 2021-06-18 MED ORDER — ADVAIR HFA 115-21 MCG/ACT IN AERO: INHALATION_SPRAY | RESPIRATORY_TRACT | 3 refills | Status: DC

## 2021-06-18 MED ORDER — VENTOLIN HFA 108 (90 BASE) MCG/ACT IN AERS: INHALATION_SPRAY | RESPIRATORY_TRACT | 3 refills | Status: DC

## 2021-06-18 NOTE — Telephone Encounter (Signed)
Patient needs today prescription sent to [facility] due to insurance issues at [place]. They wouldn't fill prescriptions.

## 2021-06-18 NOTE — Telephone Encounter (Signed)
Prescriptions sent electronically to pharmacy. Patient notified and would also like a refill on Viagra

## 2021-06-18 NOTE — Progress Notes (Signed)
Patient ID: Ronald Juarez, male    DOB: 10-Feb-1956, 65 y.o.   MRN: [id number]   Chief Complaint  Patient presents with   Annual Exam    Follow up on HTN   Subjective:    HPI The patient comes in today for a wellness visit.  A review of their health history was completed.  A review of medications was also completed.  Any needed refills; no  Eating habits: trying to eat healthy and eat better  Falls/  MVA accidents in past few months: no  Regular exercise: lots of walking  Specialist pt sees on regular basis: no  Preventative health issues were discussed.   Additional concerns: no  HTN Pt compliant with BP meds.  No SEs Denies chest pain, sob, LE swelling, or blurry vision.  Had a history of shingles in past on scalp and on side. Then had on the bottom.  Went to urgent care.  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 Wanting a refill and taking it when it flares up.  Had a left hip replacement- doing well.   Pt also has h/o erectile dysfunction- used viagra in past.  Not having any side effects.  H/o copd- stable.  Doing well with inhalers. No recent flares.  Medical History Stephone has a past medical history of Allergic rhinitis, Anxiety, Arthritis, Cancer ([place]), COPD (chronic obstructive pulmonary disease) ([place]), Depression, ED (erectile dysfunction), Finger laceration involving tendon, Hypertension, Insomnia, PNA (pneumonia), Reactive airways dysfunction syndrome ([place]), Stress, and Venous stasis.   Outpatient Encounter Medications as of 06/18/2021  Medication Sig   acetaminophen (TYLENOL) 500 MG tablet Take 500-1,000 mg by mouth 2 (two) times daily as needed for moderate pain.   ALPRAZolam (XANAX) 0.5 MG tablet Take 0.5-1 tablets (0.25-0.5 mg total) by mouth at bedtime as needed.   Sodium Chloride-Xylitol (XLEAR SINUS CARE SPRAY NA) Place 2 sprays into both nostrils daily as needed (when working).   triamcinolone cream (KENALOG) 0.1 % Apply 1 application topically 2  (two) times daily. (Patient taking differently: Apply 1 application topically daily as needed (discoloration). )   [DISCONTINUED] ADVAIR HFA 115-21 MCG/ACT inhaler INHALE 2 PUFFS BY MOUTH TWICE DAILY AS NEEDED   [DISCONTINUED] amLODipine-benazepril (LOTREL) 5-20 MG capsule Take 1 capsule by mouth once daily   [DISCONTINUED] amLODipine-benazepril (LOTREL) 5-20 MG capsule Take 1 capsule by mouth daily.   [DISCONTINUED] aspirin EC 81 MG tablet Take 1 tablet (81 mg total) by mouth 2 (two) times daily after a meal. Swallow whole. (Patient not taking: Reported on 06/18/2021)   [DISCONTINUED] buPROPion (WELLBUTRIN SR) 150 MG 12 hr tablet Take 1 tablet (150 mg total) by mouth 2 (two) times daily.   [DISCONTINUED] buPROPion (WELLBUTRIN SR) 150 MG 12 hr tablet Take 1 tablet (150 mg total) by mouth 2 (two) times daily.   [DISCONTINUED] fluticasone-salmeterol (ADVAIR HFA) 115-21 MCG/ACT inhaler INHALE 2 PUFFS BY MOUTH TWICE DAILY AS NEEDED   [DISCONTINUED] HYDROcodone-acetaminophen (NORCO/VICODIN) 5-325 MG tablet Take 1-2 tablets by mouth every 6 (six) hours as needed for moderate pain (post op pain). (Patient not taking: Reported on 12/18/2020)   [DISCONTINUED] potassium chloride SA (KLOR-CON) 20 MEQ tablet Take 1 tablet (20 mEq total) by mouth daily.   [DISCONTINUED] potassium chloride SA (KLOR-CON) 20 MEQ tablet Take 1 tablet (20 mEq total) by mouth daily.   [DISCONTINUED] sildenafil (VIAGRA) 100 MG tablet TAKE 1 TABLET BY MOUTH ONE HOURS PRIOR TO INTERCOURSE (Patient not taking: Reported on 10/30/2020)   [DISCONTINUED] tiZANidine (ZANAFLEX) 4  MG tablet Take 1 tablet (4 mg total) by mouth every 6 (six) hours as needed for muscle spasms. (Patient not taking: Reported on 12/18/2020)   [DISCONTINUED] torsemide (DEMADEX) 20 MG tablet TAKE 1 TO 2 TABLETS BY MOUTH IN THE MORNING AS NEEDED FOR SWELLING   [DISCONTINUED] torsemide (DEMADEX) 20 MG tablet TAKE 1 TO 2 TABLETS BY MOUTH IN THE MORNING AS NEEDED FOR SWELLING    [DISCONTINUED] valACYclovir (VALTREX) 500 MG tablet Take 1 tablet (500 mg total) by mouth 2 (two) times daily.   [DISCONTINUED] valACYclovir (VALTREX) 500 MG tablet Take 1 tablet (500 mg total) by mouth 2 (two) times daily.   [DISCONTINUED] VENTOLIN HFA 108 (90 Base) MCG/ACT inhaler INHALE 2 PUFFS INTO THE LUNGS EVERY 6 HOURS AS NEEDED FOR WHEEZING (Patient taking differently: Inhale 2 puffs into the lungs every 6 (six) hours as needed for wheezing. )   [DISCONTINUED] VENTOLIN HFA 108 (90 Base) MCG/ACT inhaler INHALE 2 PUFFS INTO THE LUNGS EVERY 6 HOURS AS NEEDED FOR WHEEZING   Facility-Administered Encounter Medications as of 06/18/2021  Medication   fentaNYL (SUBLIMAZE) injection 25-50 mcg   promethazine (PHENERGAN) injection 6.25-12.5 mg     Review of Systems  Constitutional:  Negative for chills and fever.  HENT:  Negative for congestion, rhinorrhea and sore throat.   Respiratory:  Negative for cough, shortness of breath and wheezing.   Cardiovascular:  Negative for chest pain and leg swelling.  Gastrointestinal:  Negative for abdominal pain, diarrhea, nausea and vomiting.  Genitourinary:  Negative for dysuria and frequency.  Skin:  Positive for rash (intermittent rash on buttock).  Neurological:  Negative for dizziness, weakness and headaches.    Vitals BP 105/61   Pulse 82   Temp 98.2 F (36.8 C) (Oral)   Ht 6' (1.829 m)   Wt 287 lb 6.4 oz (130.4 kg)   SpO2 99%   BMI 38.98 kg/m   Objective:   Physical Exam Vitals and nursing note reviewed.  Constitutional:      General: He is not in acute distress.    Appearance: Normal appearance. He is not ill-appearing.  HENT:     Head: Normocephalic.     Nose: Nose normal. No congestion.     Mouth/Throat:     Mouth: Mucous membranes are moist.     Pharynx: No oropharyngeal exudate.  Eyes:     Extraocular Movements: Extraocular movements intact.     Conjunctiva/sclera: Conjunctivae normal.     Pupils: Pupils are equal, round,  and reactive to light.  Cardiovascular:     Rate and Rhythm: Normal rate and regular rhythm.     Pulses: Normal pulses.     Heart sounds: Normal heart sounds. No murmur heard. Pulmonary:     Effort: Pulmonary effort is normal.     Breath sounds: Normal breath sounds. No wheezing, rhonchi or rales.  Musculoskeletal:        General: Normal range of motion.     Right lower leg: No edema.     Left lower leg: No edema.  Skin:    General: Skin is warm and dry.     Findings: No rash.  Neurological:     General: No focal deficit present.     Mental Status: He is alert and oriented to person, place, and time.     Cranial Nerves: No cranial nerve deficit.  Psychiatric:        Mood and Affect: Mood normal.        Behavior: Behavior  normal.        Thought Content: Thought content normal.        Judgment: Judgment normal.    Assessment and Plan   1. Well adult exam  2. Essential hypertension, benign - CBC - CMP14+EGFR - Lipid panel  3. Screening PSA (prostate specific antigen) - PSA; Future  4. Impaired fasting blood sugar - CMP14+EGFR  5. Generalized anxiety disorder  6. Venous stasis  7. Insomnia, unspecified type  8. COPD mixed type ([place])    Htn- stable. Cont meds. Recheck labs.   Anxiety/ insomnia  -stable.  Taking xanax prn. Cont wellbutrin.  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  Copd- stable. Cont inhalers.  Return in about 6 months (around 12/19/2021) for f/u htn.

## 2021-06-19 LAB — LIPID PANEL
Chol/HDL Ratio: 3.2 ratio (ref 0.0–5.0)
Cholesterol, Total: 175 mg/dL (ref 100–199)
HDL: 54 mg/dL (ref >39)
LDL Chol Calc (NIH): 100 mg/dL — ABNORMAL HIGH (ref 0–99)
Triglycerides: 120 mg/dL (ref 0–149)
VLDL Cholesterol Cal: 21 mg/dL (ref 5–40)

## 2021-06-19 LAB — CBC
Hematocrit: 45.6 % (ref 37.5–51.0)
Hemoglobin: 15.1 g/dL (ref 13.0–17.7)
MCH: 30.8 pg (ref 26.6–33.0)
MCHC: 33.1 g/dL (ref 31.5–35.7)
MCV: 93 fL (ref 79–97)
Platelets: 232 10*3/uL (ref 150–450)
RBC: 4.91 x10E6/uL (ref 4.14–5.80)
RDW: 13.5 % (ref 11.6–15.4)
WBC: 7 10*3/uL (ref 3.4–10.8)

## 2021-06-19 LAB — CMP14+EGFR
ALT: 31 IU/L (ref 0–44)
AST: 26 IU/L (ref 0–40)
Albumin/Globulin Ratio: 1.6 (ref 1.2–2.2)
Albumin: 4.4 g/dL (ref 3.8–4.8)
Alkaline Phosphatase: 76 IU/L (ref 44–121)
BUN/Creatinine Ratio: 16 (ref 10–24)
BUN: 17 mg/dL (ref 8–27)
Bilirubin Total: 0.4 mg/dL (ref 0.0–1.2)
CO2: 25 mmol/L (ref 20–29)
Calcium: 9.1 mg/dL (ref 8.6–10.2)
Chloride: 101 mmol/L (ref 96–106)
Creatinine, Ser: 1.09 mg/dL (ref 0.76–1.27)
Globulin, Total: 2.8 g/dL (ref 1.5–4.5)
Glucose: 102 mg/dL — ABNORMAL HIGH (ref 65–99)
Potassium: 4.8 mmol/L (ref 3.5–5.2)
Sodium: 141 mmol/L (ref 134–144)
Total Protein: 7.2 g/dL (ref 6.0–8.5)
eGFR: 76 mL/min/{1.73_m2} (ref >59)

## 2021-06-26 ENCOUNTER — Telehealth: Payer: Self-pay | Admitting: Family Medicine

## 2021-06-26 DIAGNOSIS — F411 Generalized anxiety disorder: Secondary | ICD-10-CM

## 2021-06-26 MED ORDER — BUPROPION HCL ER (SR) 150 MG PO TB12: 150.0000 mg | ORAL_TABLET | Freq: Two times a day (BID) | ORAL | 1 refills | Status: DC

## 2021-06-26 NOTE — Telephone Encounter (Signed)
Prescription sent electronically to pharmacy. Patient notified. 

## 2021-06-26 NOTE — Telephone Encounter (Signed)
Pt needs the medication sent to the pharmacy    buPROPion (WELLBUTRIN SR) 150 MG 12 hr tablet   [id number] [place], [place] - [address] is the pharmacy of choice from now on

## 2021-07-18 ENCOUNTER — Other Ambulatory Visit: Payer: Self-pay | Admitting: Family Medicine

## 2021-09-22 ENCOUNTER — Other Ambulatory Visit: Payer: Self-pay | Admitting: Family Medicine

## 2021-09-24 NOTE — Telephone Encounter (Signed)
Will you inquire if he is taking this chronically?  Thank you  Dr. Lacinda Axon

## 2021-09-24 NOTE — Telephone Encounter (Signed)
Pt contacted. Pt states that he only takes PRN. Pt states he has enough to last a while at this time. Please advise. Thank you.

## 2021-12-25 ENCOUNTER — Other Ambulatory Visit: Payer: Self-pay | Admitting: Family Medicine

## 2021-12-25 DIAGNOSIS — F411 Generalized anxiety disorder: Secondary | ICD-10-CM

## 2021-12-31 NOTE — Telephone Encounter (Signed)
Has appointment on 1/20 for medication follow up

## 2022-01-01 ENCOUNTER — Other Ambulatory Visit: Payer: Self-pay | Admitting: Family Medicine

## 2022-01-01 DIAGNOSIS — I10 Essential (primary) hypertension: Secondary | ICD-10-CM

## 2022-01-01 DIAGNOSIS — I878 Other specified disorders of veins: Secondary | ICD-10-CM

## 2022-01-03 ENCOUNTER — Other Ambulatory Visit: Payer: Self-pay

## 2022-01-03 ENCOUNTER — Ambulatory Visit (INDEPENDENT_AMBULATORY_CARE_PROVIDER_SITE_OTHER): Payer: BC Managed Care – PPO | Admitting: Family Medicine

## 2022-01-03 DIAGNOSIS — G4700 Insomnia, unspecified: Secondary | ICD-10-CM

## 2022-01-03 DIAGNOSIS — I10 Essential (primary) hypertension: Secondary | ICD-10-CM

## 2022-01-03 DIAGNOSIS — J449 Chronic obstructive pulmonary disease, unspecified: Secondary | ICD-10-CM | POA: Diagnosis not present

## 2022-01-03 DIAGNOSIS — F411 Generalized anxiety disorder: Secondary | ICD-10-CM | POA: Diagnosis not present

## 2022-01-03 MED ORDER — TRIAMCINOLONE ACETONIDE 0.1 % EX CREA: 1.0000 "application " | TOPICAL_CREAM | Freq: Two times a day (BID) | CUTANEOUS | 2 refills | Status: DC | PRN

## 2022-01-03 NOTE — Assessment & Plan Note (Signed)
BP stable. Continue Amlodipine/Benazepril.

## 2022-01-03 NOTE — Patient Instructions (Signed)
Work on the weight loss.  Continue your current meds.  Follow up in 6 months.

## 2022-01-03 NOTE — Assessment & Plan Note (Signed)
Stable. Continue Xanax.

## 2022-01-03 NOTE — Assessment & Plan Note (Signed)
Stable on Xanax. Continue.

## 2022-01-03 NOTE — Progress Notes (Deleted)
° ° ° ° ° ° ° ° ° ° ° ° ° ° °  Coral Spikes, DO

## 2022-01-03 NOTE — Progress Notes (Signed)
Subjective:  Patient ID: Ronald Juarez, male    DOB: [birth year]  Age: 66 y.o. MRN: [id number]  CC: Chief Complaint  Patient presents with   Establish Care    Medication followup    HPI:  66 year old male with HTN, COPD, Hx of Melanoma, Anxiety presents to establish care with me.  Hypertension Stable. Compliant with Amlodipine/Benazepril. BP 140/74. No reports of chest pain or SOB.  Insomnia & Anxiety Stable currently on Xanax. No refills needed today.  COPD Stable on Advair. No concerns at this time.  Obesity Patient worried about current weight. Diet needs improvement and he needs to be more active (per his report).  Patient Active Problem List   Diagnosis Date Noted   History of total hip replacement, left 12/18/2020   Insomnia 12/18/2020   Malignant melanoma of skin of chest ([place]) 06/14/2020   FH: colon cancer in first degree relative <33 years old 08/16/2019   Venous stasis 09/24/2015   COPD mixed type ([place]) 08/27/2015   Essential hypertension, benign 03/09/2013   Generalized anxiety disorder 03/09/2013    Social Hx   Social History   Socioeconomic History   Marital status: Widowed    Spouse name: Not on file   Number of children: Not on file   Years of education: Not on file   Highest education level: Not on file  Occupational History   Not on file  Tobacco Use   Smoking status: Former    Packs/day: 1.00    Types: Cigarettes    Quit date: 10/20/2011    Years since quitting: 10.2   Smokeless tobacco: Never  Vaping Use   Vaping Use: Never used  Substance and Sexual Activity   Alcohol use: Yes    Alcohol/week: 0.0 standard drinks    Comment: occ   Drug use: No   Sexual activity: Not on file  Other Topics Concern   Not on file  Social History Narrative   Not on file   Social Determinants of Health   Financial Resource Strain: Not on file  Food Insecurity: Not on file  Transportation Needs: Not on file  Physical Activity: Not on file   Stress: Not on file  Social Connections: Not on file    Review of Systems Per HPI  Objective:  BP 140/74    Pulse 80    Temp 97.9 F (36.6 C) (Oral)    Ht 6' (1.829 m)    Wt 291 lb 12.8 oz (132.4 kg)    SpO2 96%    BMI 39.58 kg/m   BP/Weight 01/03/2022 07/15/8562 12/18/9700  Systolic BP 637 858 850  Diastolic BP 74 61 84  Wt. (Lbs) 291.8 287.4 279.6  BMI 39.58 38.98 37.92    Physical Exam Vitals and nursing note reviewed.  Constitutional:      General: He is not in acute distress.    Appearance: He is obese.  HENT:     Head: Normocephalic and atraumatic.  Cardiovascular:     Rate and Rhythm: Normal rate and regular rhythm.  Pulmonary:     Effort: Pulmonary effort is normal.     Breath sounds: Normal breath sounds. No wheezing, rhonchi or rales.  Neurological:     Mental Status: He is alert.  Psychiatric:        Mood and Affect: Mood normal.        Behavior: Behavior normal.    Lab Results  Component Value Date   WBC 7.0 06/18/2021  HGB 15.1 06/18/2021   HCT 45.6 06/18/2021   PLT 232 06/18/2021   GLUCOSE 102 (H) 06/18/2021   CHOL 175 06/18/2021   TRIG 120 06/18/2021   HDL 54 06/18/2021   LDLCALC 100 (H) 06/18/2021   ALT 31 06/18/2021   AST 26 06/18/2021   NA 141 06/18/2021   K 4.8 06/18/2021   CL 101 06/18/2021   CREATININE 1.09 06/18/2021   BUN 17 06/18/2021   CO2 25 06/18/2021   TSH 2.480 05/07/2020   PSA 0.9 03/28/2015   INR 0.9 11/01/2020   HGBA1C 6.2 (H) 10/15/2015     Assessment & Plan:   Problem List Items Addressed This Visit       Cardiovascular and Mediastinum   Essential hypertension, benign    BP stable. Continue Amlodipine/Benazepril.         Respiratory   COPD mixed type (HCC)    Stable. Continue Advair. Advised weight loss as well.        Other   Generalized anxiety disorder    Stable on Xanax. Continue.      Insomnia    Stable. Continue Xanax.       Meds ordered this encounter  Medications   triamcinolone cream  (KENALOG) 0.1 %    Sig: Apply 1 application topically 2 (two) times daily as needed.    Dispense:  45 g    Refill:  2    Follow-up:  Return in about 6 months (around 07/03/2022).  Gregory

## 2022-01-03 NOTE — Assessment & Plan Note (Signed)
Stable. Continue Advair. Advised weight loss as well.

## 2022-02-14 IMAGING — CR DG CHEST 2V
2 series · 2 of 2 positions shown · non-contrast
Comparison: 08/10/2015

CLINICAL DATA: Preop for hip surgery.  History of chest melanoma.

EXAM:
CHEST - 2 VIEW

[w chest pa]
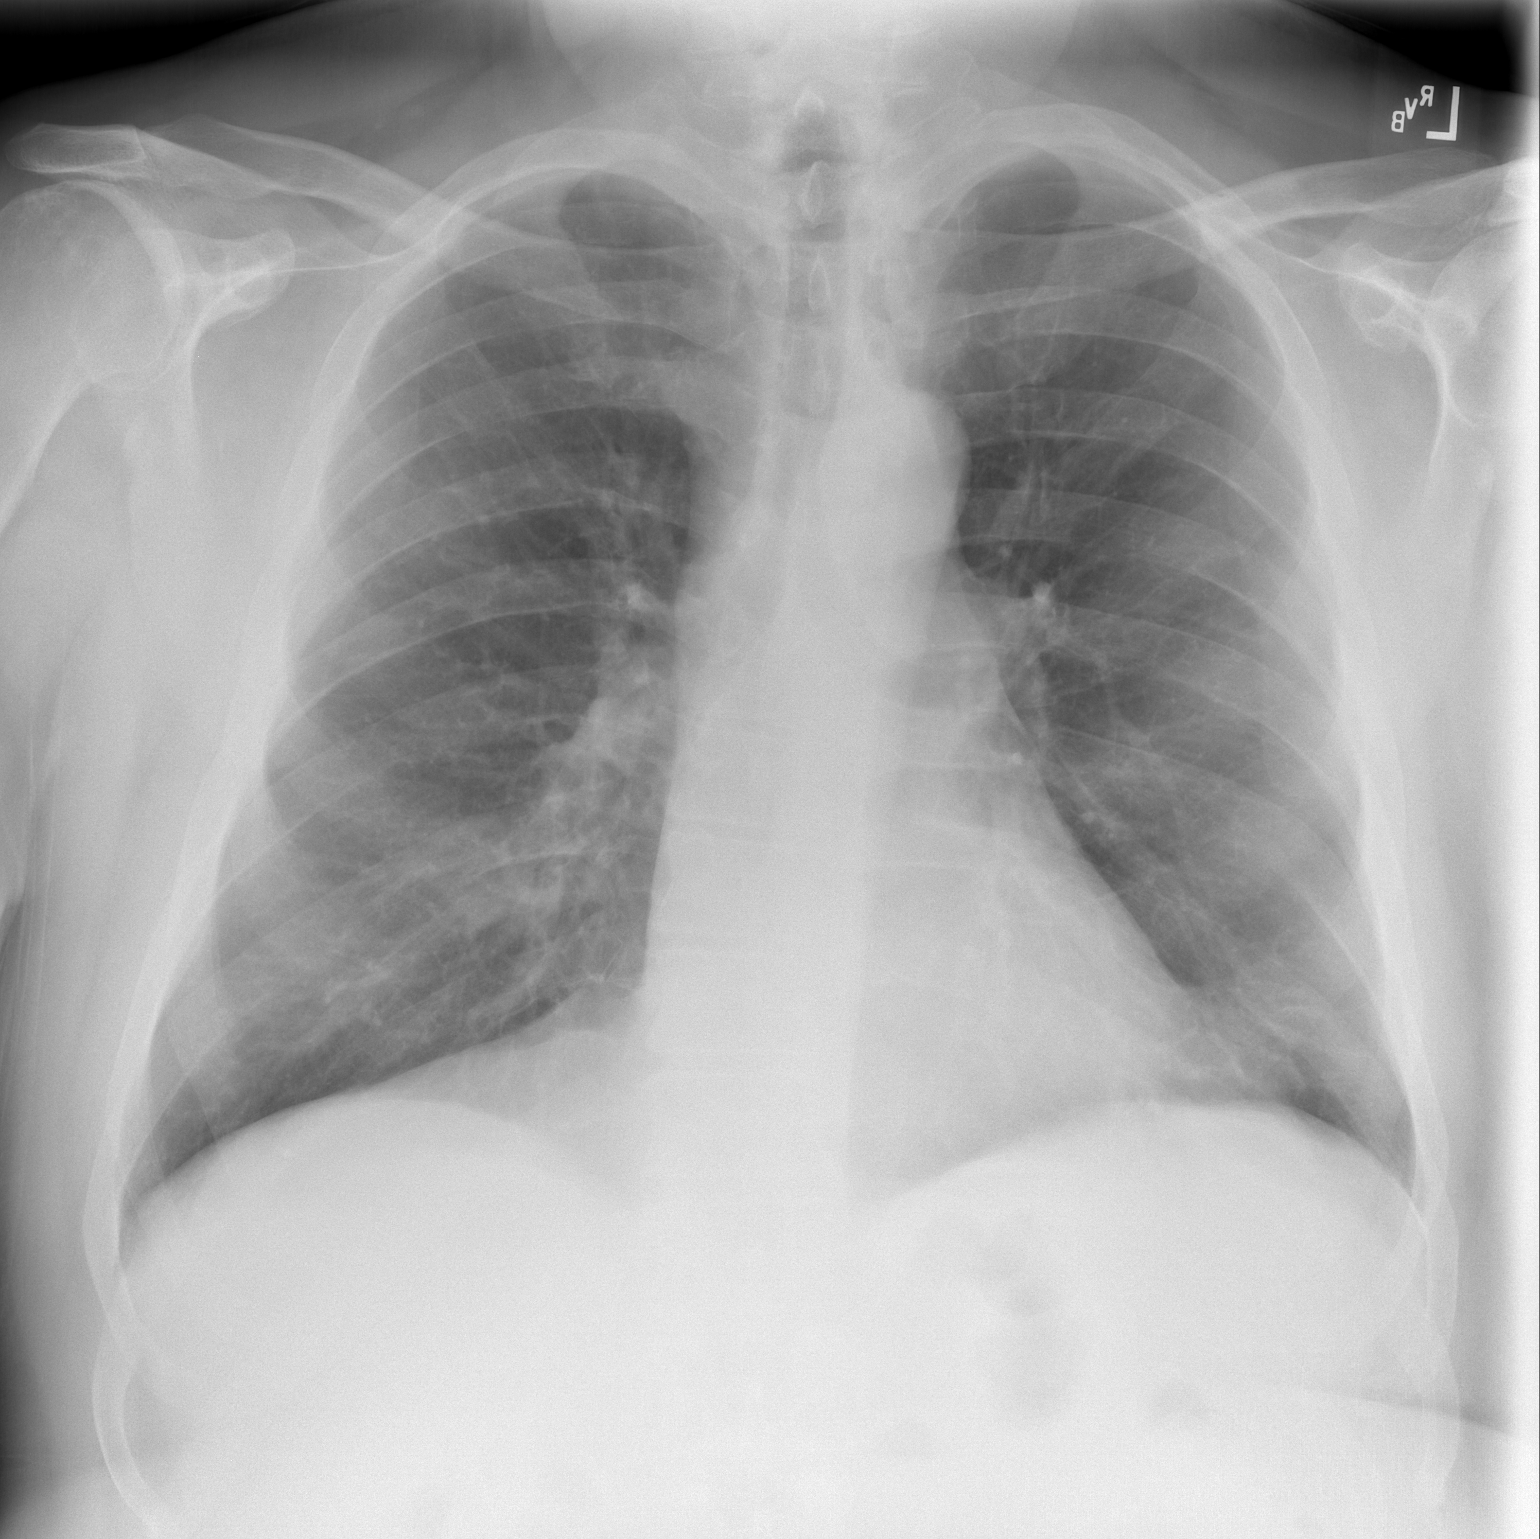

[w chest lat]
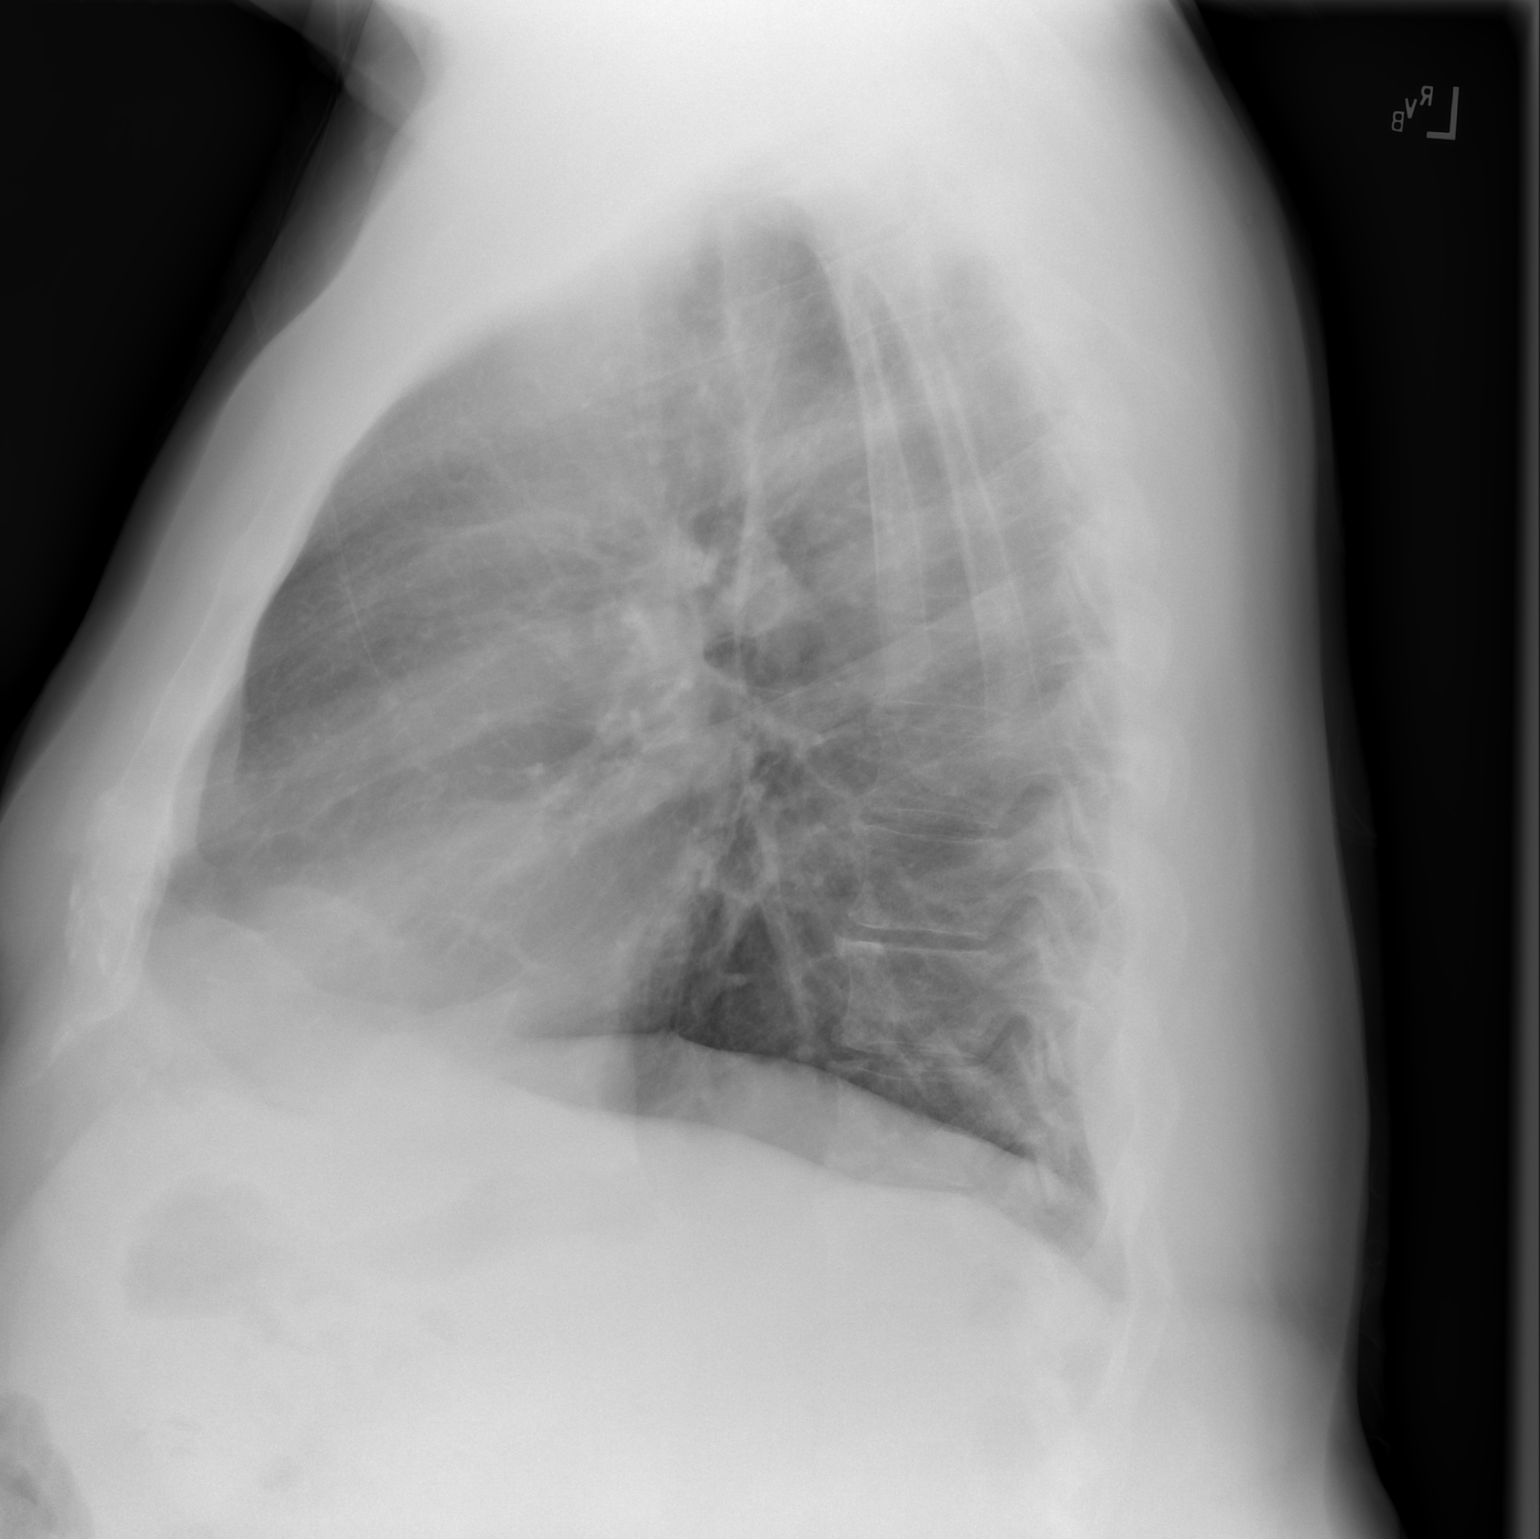

[2 of 2 positions shown; findings below may reference images not displayed]

FINDINGS: Cardiac silhouette is normal in size. Normal mediastinal and hilar
contours.

Lungs are hyperexpanded, but clear.

No pleural effusion or pneumothorax.

Skeletal structures are intact.
IMPRESSION: No active cardiopulmonary disease.

## 2022-04-02 ENCOUNTER — Other Ambulatory Visit: Payer: Self-pay | Admitting: Family Medicine

## 2022-07-03 ENCOUNTER — Encounter: Payer: Self-pay | Admitting: Family Medicine

## 2022-07-03 ENCOUNTER — Ambulatory Visit (INDEPENDENT_AMBULATORY_CARE_PROVIDER_SITE_OTHER): Payer: BC Managed Care – PPO | Admitting: Family Medicine

## 2022-07-03 VITALS — BP 131/83 | HR 63 | Temp 98.2°F | Ht 71.25 in | Wt 278.8 lb

## 2022-07-03 DIAGNOSIS — I10 Essential (primary) hypertension: Secondary | ICD-10-CM

## 2022-07-03 DIAGNOSIS — E7800 Pure hypercholesterolemia, unspecified: Secondary | ICD-10-CM | POA: Diagnosis not present

## 2022-07-03 DIAGNOSIS — Z23 Encounter for immunization: Secondary | ICD-10-CM | POA: Diagnosis not present

## 2022-07-03 DIAGNOSIS — Z130 Encounter for screening for diseases of the blood and blood-forming organs and certain disorders involving the immune mechanism: Secondary | ICD-10-CM

## 2022-07-03 DIAGNOSIS — G4700 Insomnia, unspecified: Secondary | ICD-10-CM

## 2022-07-03 DIAGNOSIS — Z0000 Encounter for general adult medical examination without abnormal findings: Secondary | ICD-10-CM

## 2022-07-03 DIAGNOSIS — R739 Hyperglycemia, unspecified: Secondary | ICD-10-CM

## 2022-07-03 DIAGNOSIS — Z125 Encounter for screening for malignant neoplasm of prostate: Secondary | ICD-10-CM

## 2022-07-03 MED ORDER — BUDESONIDE-FORMOTEROL FUMARATE 160-4.5 MCG/ACT IN AERO: 2.0000 | INHALATION_SPRAY | Freq: Two times a day (BID) | RESPIRATORY_TRACT | 3 refills | Status: DC

## 2022-07-03 MED ORDER — ALPRAZOLAM 0.5 MG PO TABS: 0.2500 mg | ORAL_TABLET | Freq: Every evening | ORAL | 2 refills | Status: AC | PRN

## 2022-07-03 NOTE — Progress Notes (Signed)
Subjective:  Patient ID: Ronald Juarez, male    DOB: [birth year]  Age: 66 y.o. MRN: [id number]  CC: Chief Complaint  Patient presents with   Annual Exam    Pt here for physical. Pt states skin cancer is coming back and he will make appt with dermatology.     HPI:  66 year old male with history of melanoma, hypertension, COPD, anxiety, insomnia, venous stasis presents for an annual physical exam.  Patient states that overall he is doing well.  Recently suffered a loss of his son to suicide.  Patient states that his Advair has become too expensive.  He would like to switch to an alternative.  Needs refill on alprazolam.  Patient also would like to discuss coming off of Wellbutrin.  In regards to his preventative care, patient declines HIV and hepatitis C screening.  We discussed shingles vaccine.  Colonoscopy up-to-date.  Needs pneumococcal vaccine.  Amenable to getting this today.  We also discussed CT lung cancer screening.  He will consider.  Patient Active Problem List   Diagnosis Date Noted   Annual physical exam 07/03/2022   History of total hip replacement, left 12/18/2020   Insomnia 12/18/2020   Malignant melanoma of skin of chest ([place]) 06/14/2020   FH: colon cancer in first degree relative <59 years old 08/16/2019   Venous stasis 09/24/2015   COPD mixed type ([place]) 08/27/2015   Essential hypertension, benign 03/09/2013   Generalized anxiety disorder 03/09/2013    Social Hx   Social History   Socioeconomic History   Marital status: Widowed    Spouse name: Not on file   Number of children: Not on file   Years of education: Not on file   Highest education level: Not on file  Occupational History   Not on file  Tobacco Use   Smoking status: Former    Packs/day: 1.00    Types: Cigarettes    Quit date: 10/20/2011    Years since quitting: 10.7   Smokeless tobacco: Never  Vaping Use   Vaping Use: Never used  Substance and Sexual Activity   Alcohol use: Yes     Alcohol/week: 0.0 standard drinks of alcohol    Comment: occ   Drug use: No   Sexual activity: Not on file  Other Topics Concern   Not on file  Social History Narrative   Not on file   Social Determinants of Health   Financial Resource Strain: Not on file  Food Insecurity: Not on file  Transportation Needs: Not on file  Physical Activity: Not on file  Stress: Not on file  Social Connections: Not on file    Review of Systems  Constitutional: Negative.   Cardiovascular:  Positive for leg swelling. Negative for chest pain.    Objective:  BP 131/83   Pulse 63   Temp 98.2 F (36.8 C)   Ht 5' 11.25" (1.81 m)   Wt 278 lb 12.8 oz (126.5 kg)   SpO2 94%   BMI 38.61 kg/m      07/03/2022    9:04 AM 01/03/2022    8:26 AM 06/18/2021    9:12 AM  BP/Weight  Systolic BP 364 680 321  Diastolic BP 83 74 61  Wt. (Lbs) 278.8 291.8 287.4  BMI 38.61 kg/m2 39.58 kg/m2 38.98 kg/m2    Physical Exam Vitals and nursing note reviewed.  Constitutional:      Appearance: Normal appearance.  HENT:     Head: Normocephalic and atraumatic.  Right Ear: Tympanic membrane normal.     Left Ear: Tympanic membrane normal.     Mouth/Throat:     Pharynx: Oropharynx is clear.  Eyes:     General:        Right eye: No discharge.        Left eye: No discharge.     Conjunctiva/sclera: Conjunctivae normal.  Cardiovascular:     Rate and Rhythm: Normal rate and regular rhythm.     Heart sounds: No murmur heard. Pulmonary:     Effort: Pulmonary effort is normal.     Breath sounds: Normal breath sounds. No wheezing, rhonchi or rales.  Abdominal:     General: There is no distension.     Palpations: Abdomen is soft.     Tenderness: There is no abdominal tenderness.  Neurological:     Mental Status: He is alert.  Psychiatric:        Mood and Affect: Mood normal.        Behavior: Behavior normal.     Lab Results  Component Value Date   WBC 7.0 06/18/2021   HGB 15.1 06/18/2021   HCT 45.6  06/18/2021   PLT 232 06/18/2021   GLUCOSE 102 (H) 06/18/2021   CHOL 175 06/18/2021   TRIG 120 06/18/2021   HDL 54 06/18/2021   LDLCALC 100 (H) 06/18/2021   ALT 31 06/18/2021   AST 26 06/18/2021   NA 141 06/18/2021   K 4.8 06/18/2021   CL 101 06/18/2021   CREATININE 1.09 06/18/2021   BUN 17 06/18/2021   CO2 25 06/18/2021   TSH 2.480 05/07/2020   PSA 0.9 03/28/2015   INR 0.9 11/01/2020   HGBA1C 6.2 (H) 10/15/2015     Assessment & Plan:   Problem List Items Addressed This Visit       Cardiovascular and Mediastinum   Essential hypertension, benign   Relevant Orders   CMP14+EGFR     Other   Annual physical exam - Primary    Pneumococcal 20 given today.  Declines HIV and hepatitis C screening.  We will consider CT lung cancer screening. Labs ordered. Patient's Advair was switched to Symbicort to see if this is more affordable. He alprazolam was refilled.      Insomnia   Relevant Medications   ALPRAZolam (XANAX) 0.5 MG tablet   Other Visit Diagnoses     Pure hypercholesterolemia       Relevant Orders   Lipid panel   Blood glucose elevated       Relevant Orders   Hemoglobin A1c   Screening for deficiency anemia       Relevant Orders   CBC   Prostate cancer screening       Relevant Orders   PSA   Need for vaccination       Relevant Orders   Pneumococcal conjugate vaccine 20-valent (Prevnar 20) (Completed)       Meds ordered this encounter  Medications   ALPRAZolam (XANAX) 0.5 MG tablet    Sig: Take 0.5-1 tablets (0.25-0.5 mg total) by mouth at bedtime as needed for anxiety or sleep.    Dispense:  30 tablet    Refill:  2   budesonide-formoterol (SYMBICORT) 160-4.5 MCG/ACT inhaler    Sig: Inhale 2 puffs into the lungs 2 (two) times daily.    Dispense:  1 each    Refill:  3    Follow-up:  Return in about 6 months (around 01/03/2023).  [place]

## 2022-07-03 NOTE — Patient Instructions (Signed)
Cut back on the Wellbutrin to 1 tablet daily. After 1 week you can discontinue if you like.  I sent in Symbicort. Let me know if the cost is prohibitive.  Labs today.  Follow up in 6 months.  Take care  Dr. Lacinda Axon

## 2022-07-03 NOTE — Assessment & Plan Note (Signed)
Pneumococcal 20 given today.  Declines HIV and hepatitis C screening.  We will consider CT lung cancer screening. Labs ordered. Patient's Advair was switched to Symbicort to see if this is more affordable. He alprazolam was refilled.

## 2022-07-04 LAB — HEMOGLOBIN A1C
Est. average glucose Bld gHb Est-mCnc: 123 mg/dL
Hgb A1c MFr Bld: 5.9 % — ABNORMAL HIGH (ref 4.8–5.6)

## 2022-07-04 LAB — CMP14+EGFR
ALT: 37 IU/L (ref 0–44)
AST: 26 IU/L (ref 0–40)
Albumin/Globulin Ratio: 1.7 (ref 1.2–2.2)
Albumin: 4.2 g/dL (ref 3.9–4.9)
Alkaline Phosphatase: 64 IU/L (ref 44–121)
BUN/Creatinine Ratio: 14 (ref 10–24)
BUN: 12 mg/dL (ref 8–27)
Bilirubin Total: 0.4 mg/dL (ref 0.0–1.2)
CO2: 23 mmol/L (ref 20–29)
Calcium: 9.1 mg/dL (ref 8.6–10.2)
Chloride: 100 mmol/L (ref 96–106)
Creatinine, Ser: 0.87 mg/dL (ref 0.76–1.27)
Globulin, Total: 2.5 g/dL (ref 1.5–4.5)
Glucose: 90 mg/dL (ref 70–99)
Potassium: 4.5 mmol/L (ref 3.5–5.2)
Sodium: 135 mmol/L (ref 134–144)
Total Protein: 6.7 g/dL (ref 6.0–8.5)
eGFR: 96 mL/min/{1.73_m2} (ref >59)

## 2022-07-04 LAB — CBC
Hematocrit: 44.3 % (ref 37.5–51.0)
Hemoglobin: 15.1 g/dL (ref 13.0–17.7)
MCH: 32 pg (ref 26.6–33.0)
MCHC: 34.1 g/dL (ref 31.5–35.7)
MCV: 94 fL (ref 79–97)
Platelets: 210 10*3/uL (ref 150–450)
RBC: 4.72 x10E6/uL (ref 4.14–5.80)
RDW: 11.9 % (ref 11.6–15.4)
WBC: 6.4 10*3/uL (ref 3.4–10.8)

## 2022-07-04 LAB — LIPID PANEL
Chol/HDL Ratio: 3.6 ratio (ref 0.0–5.0)
Cholesterol, Total: 173 mg/dL (ref 100–199)
HDL: 48 mg/dL (ref >39)
LDL Chol Calc (NIH): 105 mg/dL — ABNORMAL HIGH (ref 0–99)
Triglycerides: 110 mg/dL (ref 0–149)
VLDL Cholesterol Cal: 20 mg/dL (ref 5–40)

## 2022-07-04 LAB — PSA: Prostate Specific Ag, Serum: 0.9 ng/mL (ref 0.0–4.0)

## 2022-07-06 ENCOUNTER — Other Ambulatory Visit: Payer: Self-pay | Admitting: Family Medicine

## 2022-07-06 MED ORDER — ROSUVASTATIN CALCIUM 10 MG PO TABS: 10.0000 mg | ORAL_TABLET | Freq: Every day | ORAL | 3 refills | Status: DC

## 2022-07-09 ENCOUNTER — Other Ambulatory Visit: Payer: Self-pay | Admitting: Family Medicine

## 2022-07-09 DIAGNOSIS — F411 Generalized anxiety disorder: Secondary | ICD-10-CM

## 2022-07-09 DIAGNOSIS — I10 Essential (primary) hypertension: Secondary | ICD-10-CM

## 2022-07-09 DIAGNOSIS — I878 Other specified disorders of veins: Secondary | ICD-10-CM

## 2022-08-28 ENCOUNTER — Other Ambulatory Visit: Payer: Self-pay | Admitting: Family Medicine

## 2022-09-08 ENCOUNTER — Telehealth: Payer: Self-pay

## 2022-09-08 NOTE — Telephone Encounter (Signed)
May have 2 refills needs to do a follow-up visit if ongoing troubles or any significant issues

## 2022-09-08 NOTE — Telephone Encounter (Signed)
Encourage patient to contact the pharmacy for refills or they can request refills through [facility]  (Please schedule appointment if patient has not been seen in over a year)    WHAT PHARMACY WOULD THEY LIKE THIS SENT TO: [id number] [place] [place] [place] [id number]  MEDICATION NAME & DOSE:albuterol (VENTOLIN HFA) 108 (90 Base) MCG/ACT inhaler   NOTES/COMMENTS FROM PATIENT:      [place]: It takes 48-72 hours to process rx refill requests Ask patient to call pharmacy to ensure rx is ready before heading there.

## 2022-09-09 ENCOUNTER — Other Ambulatory Visit: Payer: Self-pay

## 2022-09-09 MED ORDER — ALBUTEROL SULFATE HFA 108 (90 BASE) MCG/ACT IN AERS: INHALATION_SPRAY | RESPIRATORY_TRACT | 2 refills | Status: DC

## 2022-09-09 MED ORDER — ALBUTEROL SULFATE HFA 108 (90 BASE) MCG/ACT IN AERS: INHALATION_SPRAY | RESPIRATORY_TRACT | 1 refills | Status: DC

## 2022-09-10 ENCOUNTER — Other Ambulatory Visit: Payer: Self-pay | Admitting: Family Medicine

## 2022-09-10 DIAGNOSIS — F411 Generalized anxiety disorder: Secondary | ICD-10-CM

## 2022-10-09 ENCOUNTER — Other Ambulatory Visit: Payer: Self-pay | Admitting: Family Medicine

## 2023-01-02 ENCOUNTER — Ambulatory Visit: Payer: BC Managed Care – PPO | Admitting: Family Medicine

## 2023-01-02 ENCOUNTER — Encounter: Payer: Self-pay | Admitting: Family Medicine

## 2023-01-02 DIAGNOSIS — I10 Essential (primary) hypertension: Secondary | ICD-10-CM

## 2023-01-02 DIAGNOSIS — E7800 Pure hypercholesterolemia, unspecified: Secondary | ICD-10-CM | POA: Diagnosis not present

## 2023-01-02 DIAGNOSIS — J449 Chronic obstructive pulmonary disease, unspecified: Secondary | ICD-10-CM

## 2023-01-02 DIAGNOSIS — F411 Generalized anxiety disorder: Secondary | ICD-10-CM | POA: Diagnosis not present

## 2023-01-02 DIAGNOSIS — C4359 Malignant melanoma of other part of trunk: Secondary | ICD-10-CM

## 2023-01-02 DIAGNOSIS — E876 Hypokalemia: Secondary | ICD-10-CM

## 2023-01-02 DIAGNOSIS — R7303 Prediabetes: Secondary | ICD-10-CM | POA: Insufficient documentation

## 2023-01-02 DIAGNOSIS — E785 Hyperlipidemia, unspecified: Secondary | ICD-10-CM | POA: Insufficient documentation

## 2023-01-02 MED ORDER — ROSUVASTATIN CALCIUM 10 MG PO TABS: 10.0000 mg | ORAL_TABLET | Freq: Every day | ORAL | 3 refills | Status: DC

## 2023-01-02 MED ORDER — BUPROPION HCL ER (SR) 150 MG PO TB12: 150.0000 mg | ORAL_TABLET | Freq: Two times a day (BID) | ORAL | 3 refills | Status: DC

## 2023-01-02 MED ORDER — AMLODIPINE BESY-BENAZEPRIL HCL 5-20 MG PO CAPS: ORAL_CAPSULE | ORAL | 3 refills | Status: DC

## 2023-01-02 MED ORDER — POTASSIUM CHLORIDE CRYS ER 20 MEQ PO TBCR: 20.0000 meq | EXTENDED_RELEASE_TABLET | Freq: Every day | ORAL | 3 refills | Status: DC

## 2023-01-02 MED ORDER — BUDESONIDE-FORMOTEROL FUMARATE 160-4.5 MCG/ACT IN AERO: 2.0000 | INHALATION_SPRAY | Freq: Two times a day (BID) | RESPIRATORY_TRACT | 3 refills | Status: DC

## 2023-01-02 NOTE — Assessment & Plan Note (Addendum)
Stable.  Continue Symbicort. Refilled today.

## 2023-01-02 NOTE — Assessment & Plan Note (Signed)
Referring back to Dr. Clovis Riley.

## 2023-01-02 NOTE — Progress Notes (Signed)
Subjective:  Patient ID: Ronald Juarez, male    DOB: Apr 22, 1956  Age: 67 y.o. MRN: [id number]  CC: Chief Complaint  Patient presents with   Hypertension    Pt states blood pressure has been good but could be better.  Pt having stressors this time of year with wife anniversary, death and sons death.    HPI:  67 year old male with hypertension, COPD, anxiety, hyperlipidemia, prediabetes presents for follow-up.  Patient's hypertension is well-controlled/at goal.  He is doing well on amlodipine and benazepril.  Patient COPD is stable on Symbicort.  Last lipid panel revealed mild LDL elevation of 105.  He is ASCVD risk score is elevated.  Patient is on Crestor.  Patient has a history of melanoma.  He has some areas of concern.  He was previously treated by Dr. Clovis Riley at [facility].  He would like to see him once again.  Patient Active Problem List   Diagnosis Date Noted   Prediabetes 01/02/2023   Hyperlipidemia 01/02/2023   History of total hip replacement, left 12/18/2020   Insomnia 12/18/2020   Malignant melanoma of skin of chest ([place]) 06/14/2020   Venous stasis 09/24/2015   COPD mixed type ([place]) 08/27/2015   Essential hypertension, benign 03/09/2013   Generalized anxiety disorder 03/09/2013    Social Hx   Social History   Socioeconomic History   Marital status: Widowed    Spouse name: Not on file   Number of children: Not on file   Years of education: Not on file   Highest education level: Not on file  Occupational History   Not on file  Tobacco Use   Smoking status: Former    Packs/day: 1.00    Types: Cigarettes    Quit date: 10/20/2011    Years since quitting: 11.2   Smokeless tobacco: Never  Vaping Use   Vaping Use: Never used  Substance and Sexual Activity   Alcohol use: Yes    Alcohol/week: 0.0 standard drinks of alcohol    Comment: occ   Drug use: No   Sexual activity: Not on file  Other Topics Concern   Not on file  Social History Narrative   Not on  file   Social Determinants of Health   Financial Resource Strain: Not on file  Food Insecurity: Not on file  Transportation Needs: Not on file  Physical Activity: Not on file  Stress: Not on file  Social Connections: Not on file    Review of Systems Per HPI  Objective:  BP 119/75   Pulse (!) 57   Temp 98.4 F (36.9 C)   Ht 5' 11.25" (1.81 m)   Wt 282 lb (127.9 kg)   SpO2 95%   BMI 39.06 kg/m      01/02/2023    8:28 AM 07/03/2022    9:04 AM 01/03/2022    8:26 AM  BP/Weight  Systolic BP 654 650 354  Diastolic BP 75 83 74  Wt. (Lbs) 282 278.8 291.8  BMI 39.06 kg/m2 38.61 kg/m2 39.58 kg/m2    Physical Exam Vitals and nursing note reviewed.  Constitutional:      Appearance: Normal appearance.  HENT:     Head: Normocephalic and atraumatic.  Eyes:     General:        Right eye: No discharge.        Left eye: No discharge.     Conjunctiva/sclera: Conjunctivae normal.  Cardiovascular:     Rate and Rhythm: Regular rhythm. Bradycardia present.  Pulmonary:  Effort: Pulmonary effort is normal.     Breath sounds: Normal breath sounds. No wheezing, rhonchi or rales.  Neurological:     Mental Status: He is alert.  Psychiatric:        Mood and Affect: Mood normal.        Behavior: Behavior normal.     Lab Results  Component Value Date   WBC 6.4 07/03/2022   HGB 15.1 07/03/2022   HCT 44.3 07/03/2022   PLT 210 07/03/2022   GLUCOSE 90 07/03/2022   CHOL 173 07/03/2022   TRIG 110 07/03/2022   HDL 48 07/03/2022   LDLCALC 105 (H) 07/03/2022   ALT 37 07/03/2022   AST 26 07/03/2022   NA 135 07/03/2022   K 4.5 07/03/2022   CL 100 07/03/2022   CREATININE 0.87 07/03/2022   BUN 12 07/03/2022   CO2 23 07/03/2022   TSH 2.480 05/07/2020   PSA 0.9 03/28/2015   INR 0.9 11/01/2020   HGBA1C 5.9 (H) 07/03/2022     Assessment & Plan:   Problem List Items Addressed This Visit       Cardiovascular and Mediastinum   Essential hypertension, benign    Stable.   Continue current medications.  Medications refilled today.      Relevant Medications   amLODipine-benazepril (LOTREL) 5-20 MG capsule   rosuvastatin (CRESTOR) 10 MG tablet     Respiratory   COPD mixed type (HCC)    Stable.  Continue Symbicort. Refilled today.      Relevant Medications   budesonide-formoterol (SYMBICORT) 160-4.5 MCG/ACT inhaler     Musculoskeletal and Integument   Malignant melanoma of skin of chest [facility])    Referring back to Dr. Clovis Riley.      Relevant Orders   Ambulatory referral to General Surgery     Other   Generalized anxiety disorder   Relevant Medications   buPROPion (WELLBUTRIN SR) 150 MG 12 hr tablet   Hyperlipidemia    Continue Crestor.      Relevant Medications   amLODipine-benazepril (LOTREL) 5-20 MG capsule   rosuvastatin (CRESTOR) 10 MG tablet   Other Visit Diagnoses     Hypokalemia       Relevant Medications   potassium chloride SA (KLOR-CON M20) 20 MEQ tablet       Meds ordered this encounter  Medications   amLODipine-benazepril (LOTREL) 5-20 MG capsule    Sig: TAKE 1 CAPSULE BY MOUTH EVERY DAY    Dispense:  90 capsule    Refill:  3   budesonide-formoterol (SYMBICORT) 160-4.5 MCG/ACT inhaler    Sig: Inhale 2 puffs into the lungs 2 (two) times daily.    Dispense:  1 each    Refill:  3   buPROPion (WELLBUTRIN SR) 150 MG 12 hr tablet    Sig: Take 1 tablet (150 mg total) by mouth 2 (two) times daily.    Dispense:  180 tablet    Refill:  3    F41.1   potassium chloride SA (KLOR-CON M20) 20 MEQ tablet    Sig: Take 1 tablet (20 mEq total) by mouth daily.    Dispense:  90 tablet    Refill:  3   rosuvastatin (CRESTOR) 10 MG tablet    Sig: Take 1 tablet (10 mg total) by mouth daily.    Dispense:  90 tablet    Refill:  3    Follow-up:  Return in about 6 months (around 07/03/2023).  [place]

## 2023-01-02 NOTE — Assessment & Plan Note (Signed)
Continue Crestor 

## 2023-01-02 NOTE — Assessment & Plan Note (Signed)
Stable.  Continue current medications.  Medications refilled today.

## 2023-01-02 NOTE — Patient Instructions (Signed)
I have placed a referral.  Continue your medications.  Follow-up in 6 months.

## 2023-06-01 ENCOUNTER — Telehealth: Payer: Self-pay | Admitting: Family Medicine

## 2023-06-01 DIAGNOSIS — J449 Chronic obstructive pulmonary disease, unspecified: Secondary | ICD-10-CM

## 2023-06-01 MED ORDER — ALBUTEROL SULFATE HFA 108 (90 BASE) MCG/ACT IN AERS
2.0000 | INHALATION_SPRAY | Freq: Four times a day (QID) | RESPIRATORY_TRACT | 5 refills | Status: DC | PRN
Start: 2023-06-01 — End: 2024-07-15

## 2023-06-01 NOTE — Telephone Encounter (Signed)
Refill on   albuterol (VENTOLIN HFA) 108 (90 Base) MCG/ACT inhaler  Must be generic brand for his insurance to pay. [facility] 

## 2023-07-03 ENCOUNTER — Ambulatory Visit: Payer: BC Managed Care – PPO | Admitting: Family Medicine

## 2023-07-03 VITALS — BP 130/81 | HR 64 | Temp 97.7°F | Ht 71.25 in | Wt 284.0 lb

## 2023-07-03 DIAGNOSIS — R7303 Prediabetes: Secondary | ICD-10-CM | POA: Diagnosis not present

## 2023-07-03 DIAGNOSIS — I10 Essential (primary) hypertension: Secondary | ICD-10-CM

## 2023-07-03 DIAGNOSIS — J449 Chronic obstructive pulmonary disease, unspecified: Secondary | ICD-10-CM | POA: Diagnosis not present

## 2023-07-03 DIAGNOSIS — Z125 Encounter for screening for malignant neoplasm of prostate: Secondary | ICD-10-CM

## 2023-07-03 DIAGNOSIS — E7800 Pure hypercholesterolemia, unspecified: Secondary | ICD-10-CM

## 2023-07-03 DIAGNOSIS — Z130 Encounter for screening for diseases of the blood and blood-forming organs and certain disorders involving the immune mechanism: Secondary | ICD-10-CM

## 2023-07-03 MED ORDER — TRELEGY ELLIPTA 100-62.5-25 MCG/ACT IN AEPB
1.0000 | INHALATION_SPRAY | Freq: Every day | RESPIRATORY_TRACT | 11 refills | Status: DC
Start: 2023-07-03 — End: 2023-07-09

## 2023-07-03 NOTE — Assessment & Plan Note (Signed)
Lipid panel to assess.  Continue Crestor at this time.  May need dose increase.

## 2023-07-03 NOTE — Progress Notes (Signed)
Subjective:  Patient ID: Ronald Juarez, male    DOB: 1956-07-02  Age: 67 y.o. MRN: [id number]  CC: Chief Complaint  Patient presents with   COPD follow up    3 to 4 weeks flaring copd - sob and lightheaded needing alternate symbicort    Leg Swelling    Follow up    HPI:  67 year old male with the below mentioned past medical history presents for follow-up.  Patient reports that he is unable to afford Symbicort.  Needs something in its place regarding his COPD.  He reports ongoing shortness of breath and associated lightheadedness.  Hypertension is well-controlled on amlodipine/benazepril.  Needs lipid panel.  Last LDL was 105.  He is compliant with Crestor.  Patient reports recent stressor has his grandchild is in a bad living situation.   Patient Active Problem List   Diagnosis Date Noted   Prediabetes 01/02/2023   Hyperlipidemia 01/02/2023   History of total hip replacement, left 12/18/2020   Insomnia 12/18/2020   Malignant melanoma of skin of chest ([facility]) 06/14/2020   Venous stasis 09/24/2015   COPD mixed type ([facility]) 08/27/2015   Essential hypertension, benign 03/09/2013   Generalized anxiety disorder 03/09/2013    Social Hx   Social History   Socioeconomic History   Marital status: Widowed    Spouse name: Not on file   Number of children: Not on file   Years of education: Not on file   Highest education level: Not on file  Occupational History   Not on file  Tobacco Use   Smoking status: Former    Current packs/day: 0.00    Types: Cigarettes    Quit date: 10/20/2011    Years since quitting: 11.7   Smokeless tobacco: Never  Vaping Use   Vaping status: Never Used  Substance and Sexual Activity   Alcohol use: Yes    Alcohol/week: 0.0 standard drinks of alcohol    Comment: occ   Drug use: No   Sexual activity: Not on file  Other Topics Concern   Not on file  Social History Narrative   Not on file   Social Determinants of Health   Financial  Resource Strain: Not on file  Food Insecurity: Not on file  Transportation Needs: Not on file  Physical Activity: Not on file  Stress: Not on file  Social Connections: Not on file    Review of Systems  Respiratory:  Positive for shortness of breath.   Cardiovascular:  Negative for chest pain.    Objective:  BP 130/81   Pulse 64   Temp 97.7 F (36.5 C)   Ht 5' 11.25" (1.81 m)   Wt 284 lb (128.8 kg)   SpO2 95%   BMI 39.33 kg/m      07/03/2023    8:26 AM 01/02/2023    8:28 AM 07/03/2022    9:04 AM  BP/Weight  Systolic BP 130 119 131  Diastolic BP 81 75 83  Wt. (Lbs) 284 282 278.8  BMI 39.33 kg/m2 39.06 kg/m2 38.61 kg/m2    Physical Exam Vitals and nursing note reviewed.  Constitutional:      Appearance: Normal appearance. He is obese.  HENT:     Head: Normocephalic and atraumatic.  Eyes:     General:        Right eye: No discharge.        Left eye: No discharge.     Conjunctiva/sclera: Conjunctivae normal.  Cardiovascular:     Rate and Rhythm:  Normal rate and regular rhythm.  Pulmonary:     Effort: Pulmonary effort is normal.     Breath sounds: Normal breath sounds. No wheezing, rhonchi or rales.  Skin:    Comments: Lower extremities with hyperpigmentation secondary to chronic venous insufficiency.  Neurological:     Mental Status: He is alert.     Lab Results  Component Value Date   WBC 6.4 07/03/2022   HGB 15.1 07/03/2022   HCT 44.3 07/03/2022   PLT 210 07/03/2022   GLUCOSE 90 07/03/2022   CHOL 173 07/03/2022   TRIG 110 07/03/2022   HDL 48 07/03/2022   LDLCALC 105 (H) 07/03/2022   ALT 37 07/03/2022   AST 26 07/03/2022   NA 135 07/03/2022   K 4.5 07/03/2022   CL 100 07/03/2022   CREATININE 0.87 07/03/2022   BUN 12 07/03/2022   CO2 23 07/03/2022   TSH 2.480 05/07/2020   PSA 0.9 03/28/2015   INR 0.9 11/01/2020   HGBA1C 5.9 (H) 07/03/2022     Assessment & Plan:   Problem List Items Addressed This Visit       Cardiovascular and  Mediastinum   Essential hypertension, benign - Primary    Stable.  Continue current medication.      Relevant Orders   CMP14+EGFR   Microalbumin / creatinine urine ratio     Respiratory   COPD mixed type (HCC)    Uncontrolled.  Starting on Trelegy.      Relevant Medications   Fluticasone-Umeclidin-Vilant (TRELEGY ELLIPTA) 100-62.5-25 MCG/ACT AEPB     Other   Prediabetes   Relevant Orders   Hemoglobin A1c   Hyperlipidemia    Lipid panel to assess.  Continue Crestor at this time.  May need dose increase.      Relevant Orders   Lipid panel   Other Visit Diagnoses     Prostate cancer screening       Relevant Orders   PSA   Screening for deficiency anemia       Relevant Orders   CBC       Meds ordered this encounter  Medications   Fluticasone-Umeclidin-Vilant (TRELEGY ELLIPTA) 100-62.5-25 MCG/ACT AEPB    Sig: Inhale 1 puff into the lungs daily.    Dispense:  1 each    Refill:  11    Follow-up:  Return in about 6 months (around 01/03/2024).  Everlene Other DO [facility]

## 2023-07-03 NOTE — Assessment & Plan Note (Signed)
Uncontrolled.  Starting on Trelegy.

## 2023-07-03 NOTE — Assessment & Plan Note (Signed)
Stable.  Continue current medication

## 2023-07-03 NOTE — Patient Instructions (Addendum)
Medication as prescribed.  Labs today.  Follow up in 6 months.  Take care  Dr. Cook  

## 2023-07-04 LAB — MICROALBUMIN / CREATININE URINE RATIO
Creatinine, Urine: 129.3 mg/dL
Microalb/Creat Ratio: 12 mg/g creat (ref 0–29)
Microalbumin, Urine: 15.2 ug/mL

## 2023-07-04 LAB — LIPID PANEL
Chol/HDL Ratio: 3.1 ratio (ref 0.0–5.0)
Cholesterol, Total: 165 mg/dL (ref 100–199)
HDL: 53 mg/dL (ref >39)
LDL Chol Calc (NIH): 89 mg/dL (ref 0–99)
Triglycerides: 129 mg/dL (ref 0–149)
VLDL Cholesterol Cal: 23 mg/dL (ref 5–40)

## 2023-07-04 LAB — CMP14+EGFR
ALT: 31 IU/L (ref 0–44)
AST: 22 IU/L (ref 0–40)
Albumin: 4.2 g/dL (ref 3.9–4.9)
Alkaline Phosphatase: 65 IU/L (ref 44–121)
BUN/Creatinine Ratio: 19 (ref 10–24)
BUN: 16 mg/dL (ref 8–27)
Bilirubin Total: 0.4 mg/dL (ref 0.0–1.2)
CO2: 27 mmol/L (ref 20–29)
Calcium: 9.2 mg/dL (ref 8.6–10.2)
Chloride: 103 mmol/L (ref 96–106)
Creatinine, Ser: 0.84 mg/dL (ref 0.76–1.27)
Globulin, Total: 2.5 g/dL (ref 1.5–4.5)
Glucose: 97 mg/dL (ref 70–99)
Potassium: 4.9 mmol/L (ref 3.5–5.2)
Sodium: 141 mmol/L (ref 134–144)
Total Protein: 6.7 g/dL (ref 6.0–8.5)
eGFR: 96 mL/min/{1.73_m2} (ref >59)

## 2023-07-04 LAB — CBC
Hematocrit: 44.1 % (ref 37.5–51.0)
Hemoglobin: 14.7 g/dL (ref 13.0–17.7)
MCH: 31.6 pg (ref 26.6–33.0)
MCHC: 33.3 g/dL (ref 31.5–35.7)
MCV: 95 fL (ref 79–97)
Platelets: 177 10*3/uL (ref 150–450)
RBC: 4.65 x10E6/uL (ref 4.14–5.80)
RDW: 12.8 % (ref 11.6–15.4)
WBC: 7.6 10*3/uL (ref 3.4–10.8)

## 2023-07-04 LAB — HEMOGLOBIN A1C
Est. average glucose Bld gHb Est-mCnc: 123 mg/dL
Hgb A1c MFr Bld: 5.9 % — ABNORMAL HIGH (ref 4.8–5.6)

## 2023-07-04 LAB — PSA: Prostate Specific Ag, Serum: 1.3 ng/mL (ref 0.0–4.0)

## 2023-07-09 ENCOUNTER — Telehealth: Payer: Self-pay

## 2023-07-09 ENCOUNTER — Other Ambulatory Visit: Payer: Self-pay | Admitting: Family Medicine

## 2023-07-09 MED ORDER — BREZTRI AEROSPHERE 160-9-4.8 MCG/ACT IN AERO: 2.0000 | INHALATION_SPRAY | Freq: Two times a day (BID) | RESPIRATORY_TRACT | 11 refills | Status: DC

## 2023-07-09 NOTE — Telephone Encounter (Signed)
Patient called and stated that the following medication you put him on Fluticasone-Umeclidin-Vilant (TRELEGY ELLIPTA) 100-62.5-25 MCG/ACT AEPB is to expensive.

## 2023-07-14 NOTE — Telephone Encounter (Signed)
Error

## 2023-10-24 ENCOUNTER — Other Ambulatory Visit: Payer: Self-pay | Admitting: Family Medicine

## 2023-12-27 ENCOUNTER — Other Ambulatory Visit: Payer: Self-pay | Admitting: Family Medicine

## 2024-01-04 ENCOUNTER — Ambulatory Visit: Payer: BC Managed Care – PPO | Admitting: Family Medicine

## 2024-01-04 VITALS — BP 130/82 | HR 55 | Temp 97.7°F | Ht 71.25 in | Wt 277.2 lb

## 2024-01-04 DIAGNOSIS — R7303 Prediabetes: Secondary | ICD-10-CM | POA: Diagnosis not present

## 2024-01-04 DIAGNOSIS — Z85828 Personal history of other malignant neoplasm of skin: Secondary | ICD-10-CM | POA: Diagnosis not present

## 2024-01-04 DIAGNOSIS — J449 Chronic obstructive pulmonary disease, unspecified: Secondary | ICD-10-CM

## 2024-01-04 DIAGNOSIS — Z130 Encounter for screening for diseases of the blood and blood-forming organs and certain disorders involving the immune mechanism: Secondary | ICD-10-CM

## 2024-01-04 DIAGNOSIS — E7800 Pure hypercholesterolemia, unspecified: Secondary | ICD-10-CM | POA: Diagnosis not present

## 2024-01-04 DIAGNOSIS — I10 Essential (primary) hypertension: Secondary | ICD-10-CM | POA: Diagnosis not present

## 2024-01-04 DIAGNOSIS — F411 Generalized anxiety disorder: Secondary | ICD-10-CM

## 2024-01-04 MED ORDER — BREZTRI AEROSPHERE 160-9-4.8 MCG/ACT IN AERO: 2.0000 | INHALATION_SPRAY | Freq: Two times a day (BID) | RESPIRATORY_TRACT | 11 refills | Status: DC

## 2024-01-04 NOTE — Patient Instructions (Signed)
Labs ordered.  Breztri refilled.  Referral placed to dermatology.  Follow up in 6 months.

## 2024-01-04 NOTE — Assessment & Plan Note (Signed)
Stopped Crestor due to side effects.  Reassessing today with lipid panel.

## 2024-01-04 NOTE — Assessment & Plan Note (Signed)
Restarting Breztri.

## 2024-01-04 NOTE — Progress Notes (Signed)
Subjective:  Patient ID: Ronald Juarez, male    DOB: 1956/04/22  Age: 68 y.o. MRN: [id number]  CC:  Follow up   HPI:  68 year old male presents for follow-up.  Patient states that overall he is doing well.  He states that he has some intermittent balance difficulty with abrupt movements.  He states that he would like to discuss dietary changes to help with weight loss.  Patient has underlying COPD.  He is not currently taking Breztri.  However, he is interested in having this restarted.  Patient states that he did not tolerate Crestor due to nausea.  Medication has been discontinued.  Patient declines influenza vaccine.  He states that he would like to see a dermatologist.  He has some areas of concern on the forearms which are likely precancerous lesions.  Would like a referral to dermatology.  Has a previous history of melanoma.  Patient Active Problem List   Diagnosis Date Noted   Prediabetes 01/02/2023   Hyperlipidemia 01/02/2023   History of total hip replacement, left 12/18/2020   Insomnia 12/18/2020   Malignant melanoma of skin of chest (HCC) 06/14/2020   Venous stasis 09/24/2015   COPD mixed type (HCC) 08/27/2015   Essential hypertension, benign 03/09/2013   Generalized anxiety disorder 03/09/2013    Social Hx   Social History   Socioeconomic History   Marital status: Widowed    Spouse name: Not on file   Number of children: Not on file   Years of education: Not on file   Highest education level: Not on file  Occupational History   Not on file  Tobacco Use   Smoking status: Former    Current packs/day: 0.00    Types: Cigarettes    Quit date: 10/20/2011    Years since quitting: 12.2   Smokeless tobacco: Never  Vaping Use   Vaping status: Never Used  Substance and Sexual Activity   Alcohol use: Yes    Alcohol/week: 0.0 standard drinks of alcohol    Comment: occ   Drug use: No   Sexual activity: Not on file  Other Topics Concern   Not on file  Social  History Narrative   Not on file   Social Drivers of Health   Financial Resource Strain: Not on file  Food Insecurity: Not on file  Transportation Needs: Not on file  Physical Activity: Not on file  Stress: Not on file  Social Connections: Not on file    Review of Systems Per HPI  Objective:  BP 130/82   Pulse (!) 55   Temp 97.7 F (36.5 C)   Ht 5' 11.25" (1.81 m)   Wt 277 lb 3.2 oz (125.7 kg)   SpO2 97%   BMI 38.39 kg/m      01/04/2024    8:51 AM 07/03/2023    8:26 AM 01/02/2023    8:28 AM  BP/Weight  Systolic BP 130 130 119  Diastolic BP 82 81 75  Wt. (Lbs) 277.2 284 282  BMI 38.39 kg/m2 39.33 kg/m2 39.06 kg/m2    Physical Exam Vitals and nursing note reviewed.  Constitutional:      General: He is not in acute distress.    Appearance: Normal appearance. He is obese.  HENT:     Head: Normocephalic and atraumatic.  Eyes:     General:        Right eye: No discharge.        Left eye: No discharge.     Conjunctiva/sclera:  Conjunctivae normal.  Cardiovascular:     Rate and Rhythm: Normal rate and regular rhythm.  Pulmonary:     Effort: Pulmonary effort is normal.     Breath sounds: Normal breath sounds. No wheezing, rhonchi or rales.  Neurological:     Mental Status: He is alert.  Psychiatric:        Mood and Affect: Mood normal.        Behavior: Behavior normal.     Lab Results  Component Value Date   WBC 7.6 07/03/2023   HGB 14.7 07/03/2023   HCT 44.1 07/03/2023   PLT 177 07/03/2023   GLUCOSE 97 07/03/2023   CHOL 165 07/03/2023   TRIG 129 07/03/2023   HDL 53 07/03/2023   LDLCALC 89 07/03/2023   ALT 31 07/03/2023   AST 22 07/03/2023   NA 141 07/03/2023   K 4.9 07/03/2023   CL 103 07/03/2023   CREATININE 0.84 07/03/2023   BUN 16 07/03/2023   CO2 27 07/03/2023   TSH 2.480 05/07/2020   PSA 0.9 03/28/2015   INR 0.9 11/01/2020   HGBA1C 5.9 (H) 07/03/2023     Assessment & Plan:   Problem List Items Addressed This Visit        Cardiovascular and Mediastinum   Essential hypertension, benign - Primary   BP stable.  Continue amlodipine/benazepril.  Labs today.      Relevant Orders   CMP14+EGFR   Microalbumin / creatinine urine ratio     Respiratory   COPD mixed type (HCC)   Restarting Breztri.      Relevant Medications   Budeson-Glycopyrrol-Formoterol (BREZTRI AEROSPHERE) 160-9-4.8 MCG/ACT AERO     Other   Prediabetes   Relevant Orders   Hemoglobin A1c   Hyperlipidemia   Stopped Crestor due to side effects.  Reassessing today with lipid panel.      Relevant Orders   Lipid panel   Generalized anxiety disorder   Stable currently.  Alprazolam as needed.  Continue Wellbutrin.      Other Visit Diagnoses       History of skin cancer       Relevant Orders   Ambulatory referral to Dermatology     Screening for deficiency anemia       Relevant Orders   CBC       Meds ordered this encounter  Medications   Budeson-Glycopyrrol-Formoterol (BREZTRI AEROSPHERE) 160-9-4.8 MCG/ACT AERO    Sig: Inhale 2 puffs into the lungs 2 (two) times daily.    Dispense:  10.7 g    Refill:  11    Follow-up: 6 months  Aurelia Gras Adriana Simas DO [facility]

## 2024-01-04 NOTE — Assessment & Plan Note (Signed)
Stable currently.  Alprazolam as needed.  Continue Wellbutrin.

## 2024-01-04 NOTE — Assessment & Plan Note (Signed)
BP stable.  Continue amlodipine/benazepril.  Labs today.

## 2024-01-07 ENCOUNTER — Other Ambulatory Visit: Payer: Self-pay | Admitting: Family Medicine

## 2024-01-07 DIAGNOSIS — E876 Hypokalemia: Secondary | ICD-10-CM

## 2024-02-18 ENCOUNTER — Other Ambulatory Visit: Payer: Self-pay | Admitting: Family Medicine

## 2024-02-18 DIAGNOSIS — I10 Essential (primary) hypertension: Secondary | ICD-10-CM

## 2024-02-18 DIAGNOSIS — F411 Generalized anxiety disorder: Secondary | ICD-10-CM

## 2024-03-27 ENCOUNTER — Other Ambulatory Visit: Payer: Self-pay | Admitting: Family Medicine

## 2024-03-28 ENCOUNTER — Ambulatory Visit: Payer: Self-pay | Admitting: Family Medicine

## 2024-03-28 ENCOUNTER — Other Ambulatory Visit: Payer: Self-pay

## 2024-03-28 MED ORDER — SILDENAFIL CITRATE 100 MG PO TABS: ORAL_TABLET | ORAL | 0 refills | Status: DC

## 2024-03-28 NOTE — Telephone Encounter (Signed)
 Copied from CRM [id number]. Topic: Clinical - Medication Question >> Mar 28, 2024  9:57 AM Marissa P wrote: Reason for CRM: Patient called in wanting to speak to someone because the pharmacy advised him that the medication is denied and he needs his meds. According to cal all the nurses are backed up and busy. He is expecting a callback when available [phone]

## 2024-03-28 NOTE — Telephone Encounter (Signed)
 Prescription was approved and confirmed by pharmacy at 9:09 this am. Patient notified and will reach out to pharmacy.

## 2024-04-30 ENCOUNTER — Other Ambulatory Visit: Payer: Self-pay | Admitting: Family Medicine

## 2024-05-26 ENCOUNTER — Encounter: Payer: Self-pay | Admitting: Family Medicine

## 2024-05-26 ENCOUNTER — Ambulatory Visit: Payer: BC Managed Care – PPO | Admitting: Family Medicine

## 2024-05-26 VITALS — BP 120/74 | HR 59 | Temp 97.3°F | Ht 71.25 in | Wt 268.0 lb

## 2024-05-26 DIAGNOSIS — Z0000 Encounter for general adult medical examination without abnormal findings: Secondary | ICD-10-CM | POA: Diagnosis not present

## 2024-05-26 MED ORDER — SILDENAFIL CITRATE 100 MG PO TABS: ORAL_TABLET | ORAL | 11 refills | Status: AC

## 2024-05-26 NOTE — Progress Notes (Signed)
 Subjective:  Patient ID: Ronald Juarez, male    DOB: [birth year]  Age: 68 y.o. MRN: [id number]  CC: Follow-up   HPI:  68 year old male presents for follow-up.  Patient states that he is doing well.  Denies chest pain or shortness of breath.  Hypertension stable on amlodipine /benazepril .  COPD stable on Breztri .  Patient needs refill on sildenafil .  Patient got labs drawn this morning.  Health maintenance section up-to-date except for shingles vaccine.   Patient Active Problem List   Diagnosis Date Noted   Annual physical exam 05/26/2024   Prediabetes 01/02/2023   Hyperlipidemia 01/02/2023   History of total hip replacement, left 12/18/2020   Insomnia 12/18/2020   Malignant melanoma of skin of chest (HCC) 06/14/2020   Venous stasis 09/24/2015   COPD mixed type (HCC) 08/27/2015   Essential hypertension, benign 03/09/2013   Generalized anxiety disorder 03/09/2013    Social Hx   Social History   Socioeconomic History   Marital status: Widowed    Spouse name: Not on file   Number of children: Not on file   Years of education: Not on file   Highest education level: Not on file  Occupational History   Not on file  Tobacco Use   Smoking status: Former    Current packs/day: 0.00    Types: Cigarettes    Quit date: 10/20/2011    Years since quitting: 12.6   Smokeless tobacco: Never  Vaping Use   Vaping status: Never Used  Substance and Sexual Activity   Alcohol use: Yes    Alcohol/week: 0.0 standard drinks of alcohol    Comment: occ   Drug use: No   Sexual activity: Not on file  Other Topics Concern   Not on file  Social History Narrative   Not on file   Social Drivers of Health   Financial Resource Strain: Not on file  Food Insecurity: Not on file  Transportation Needs: Not on file  Physical Activity: Not on file  Stress: Not on file  Social Connections: Not on file    Review of Systems Per HPI  Objective:  BP 120/74   Pulse (!) 59   Temp (!)  97.3 F (36.3 C)   Ht 5' 11.25 (1.81 m)   Wt 268 lb (121.6 kg)   SpO2 96%   BMI 37.12 kg/m      05/26/2024    8:43 AM 01/04/2024    8:51 AM 07/03/2023    8:26 AM  BP/Weight  Systolic BP 120 130 130  Diastolic BP 74 82 81  Wt. (Lbs) 268 277.2 284  BMI 37.12 kg/m2 38.39 kg/m2 39.33 kg/m2    Physical Exam Vitals and nursing note reviewed.  Constitutional:      General: He is not in acute distress.    Appearance: Normal appearance. He is obese.  HENT:     Head: Normocephalic and atraumatic.   Eyes:     General:        Right eye: No discharge.        Left eye: No discharge.     Conjunctiva/sclera: Conjunctivae normal.    Cardiovascular:     Rate and Rhythm: Normal rate and regular rhythm.  Pulmonary:     Effort: Pulmonary effort is normal.     Breath sounds: Normal breath sounds. No wheezing, rhonchi or rales.   Musculoskeletal:     Cervical back: Neck supple.   Neurological:     Mental Status: He is alert.  Psychiatric:        Mood and Affect: Mood normal.        Behavior: Behavior normal.     Lab Results  Component Value Date   WBC 7.6 07/03/2023   HGB 14.7 07/03/2023   HCT 44.1 07/03/2023   PLT 177 07/03/2023   GLUCOSE 97 07/03/2023   CHOL 165 07/03/2023   TRIG 129 07/03/2023   HDL 53 07/03/2023   LDLCALC 89 07/03/2023   ALT 31 07/03/2023   AST 22 07/03/2023   NA 141 07/03/2023   K 4.9 07/03/2023   CL 103 07/03/2023   CREATININE 0.84 07/03/2023   BUN 16 07/03/2023   CO2 27 07/03/2023   TSH 2.480 05/07/2020   PSA 0.9 03/28/2015   INR 0.9 11/01/2020   HGBA1C 5.9 (H) 07/03/2023     Assessment & Plan:  Annual physical exam Assessment & Plan: Overall doing well.  Had labs drawn this morning.  Requesting medications refilled.  Preventative health section is up-to-date excluding shingles vaccine which he can get at his pharmacy if he desires.   Other orders -     Sildenafil  Citrate; TAKE 1 TABLET BY MOUTH ONE HOURS PRIOR TO INTERCOURSE   Dispense: 10 tablet; Refill: 11   Follow-up:  Return in about 6 months (around 11/25/2024).  Kathleen Papa DO [facility]

## 2024-05-26 NOTE — Assessment & Plan Note (Signed)
 Overall doing well.  Had labs drawn this morning.  Requesting medications refilled.  Preventative health section is up-to-date excluding shingles vaccine which he can get at his pharmacy if he desires.

## 2024-05-26 NOTE — Patient Instructions (Signed)
 Continue your medications.    Follow up in 6 months

## 2024-05-28 LAB — CMP14+EGFR
ALT: 26 IU/L (ref 0–44)
AST: 24 IU/L (ref 0–40)
Albumin: 4.4 g/dL (ref 3.9–4.9)
Alkaline Phosphatase: 70 IU/L (ref 44–121)
BUN/Creatinine Ratio: 19 (ref 10–24)
BUN: 18 mg/dL (ref 8–27)
Bilirubin Total: 0.4 mg/dL (ref 0.0–1.2)
CO2: 23 mmol/L (ref 20–29)
Calcium: 9.4 mg/dL (ref 8.6–10.2)
Chloride: 101 mmol/L (ref 96–106)
Creatinine, Ser: 0.95 mg/dL (ref 0.76–1.27)
Globulin, Total: 2.5 g/dL (ref 1.5–4.5)
Glucose: 93 mg/dL (ref 70–99)
Potassium: 5.2 mmol/L (ref 3.5–5.2)
Sodium: 139 mmol/L (ref 134–144)
Total Protein: 6.9 g/dL (ref 6.0–8.5)
eGFR: 88 mL/min/{1.73_m2} (ref >59)

## 2024-05-28 LAB — MICROALBUMIN / CREATININE URINE RATIO
Creatinine, Urine: 117.6 mg/dL
Microalb/Creat Ratio: 10 mg/g{creat} (ref 0–29)
Microalbumin, Urine: 11.5 ug/mL

## 2024-05-28 LAB — HEMOGLOBIN A1C
Est. average glucose Bld gHb Est-mCnc: 120 mg/dL
Hgb A1c MFr Bld: 5.8 % — ABNORMAL HIGH (ref 4.8–5.6)

## 2024-05-28 LAB — CBC
Hematocrit: 51 % (ref 37.5–51.0)
Hemoglobin: 16.5 g/dL (ref 13.0–17.7)
MCH: 32 pg (ref 26.6–33.0)
MCHC: 32.4 g/dL (ref 31.5–35.7)
MCV: 99 fL — ABNORMAL HIGH (ref 79–97)
Platelets: 218 10*3/uL (ref 150–450)
RBC: 5.15 x10E6/uL (ref 4.14–5.80)
RDW: 13.5 % (ref 11.6–15.4)
WBC: 8 10*3/uL (ref 3.4–10.8)

## 2024-05-28 LAB — LIPID PANEL
Chol/HDL Ratio: 3.5 ratio (ref 0.0–5.0)
Cholesterol, Total: 172 mg/dL (ref 100–199)
HDL: 49 mg/dL (ref >39)
LDL Chol Calc (NIH): 106 mg/dL — ABNORMAL HIGH (ref 0–99)
Triglycerides: 93 mg/dL (ref 0–149)
VLDL Cholesterol Cal: 17 mg/dL (ref 5–40)

## 2024-05-30 ENCOUNTER — Ambulatory Visit: Payer: Self-pay | Admitting: Family Medicine

## 2024-06-01 ENCOUNTER — Other Ambulatory Visit: Payer: Self-pay | Admitting: Family Medicine

## 2024-06-01 MED ORDER — ROSUVASTATIN CALCIUM 5 MG PO TABS: 5.0000 mg | ORAL_TABLET | Freq: Every day | ORAL | 3 refills | Status: DC

## 2024-06-10 ENCOUNTER — Other Ambulatory Visit: Payer: Self-pay | Admitting: Family Medicine

## 2024-07-15 ENCOUNTER — Other Ambulatory Visit: Payer: Self-pay | Admitting: Family Medicine

## 2024-07-15 DIAGNOSIS — J449 Chronic obstructive pulmonary disease, unspecified: Secondary | ICD-10-CM

## 2024-07-19 ENCOUNTER — Other Ambulatory Visit: Payer: Self-pay | Admitting: Family Medicine

## 2024-07-19 ENCOUNTER — Telehealth: Payer: Self-pay

## 2024-07-19 DIAGNOSIS — F411 Generalized anxiety disorder: Secondary | ICD-10-CM

## 2024-07-19 MED ORDER — BUPROPION HCL ER (SR) 150 MG PO TB12: 150.0000 mg | ORAL_TABLET | Freq: Two times a day (BID) | ORAL | 0 refills | Status: DC

## 2024-07-19 NOTE — Telephone Encounter (Signed)
 Communication  Reason for CRM: The patient is requesting to see what medication he was supposed to be starting for his cholesterol as his Pharmacy states none of his medications are for his cholesterol. The patient is available in the mornings until 12 PM as he works second shift. His call back number is [phone].   Informed patient that Crestor  is his cholesterol medication, patient verbalized understanding

## 2024-07-19 NOTE — Telephone Encounter (Signed)
 Copied from CRM #[id number]. Topic: Clinical - Medication Refill >> Jul 19, 2024  9:14 AM Elle L wrote: Medication: buPROPion  (WELLBUTRIN  SR) 150 MG 12 hr tablet  Has the patient contacted their pharmacy? Yes  This is the patient's preferred pharmacy:  [id number] - [place] [place] - [address] [facility] [address] [place] [address] Phone: [phone] Fax: [phone]  Is this the correct pharmacy for this prescription? Yes  Has the prescription been filled recently? Yes  Is the patient out of the medication? No, 3 days left.   Has the patient been seen for an appointment in the last year OR does the patient have an upcoming appointment? Yes  Can we respond through MyChart? No  Agent: Please be advised that Rx refills may take up to 3 business days. We ask that you follow-up with your pharmacy.

## 2024-08-09 ENCOUNTER — Other Ambulatory Visit: Payer: Self-pay | Admitting: Family Medicine

## 2024-09-15 ENCOUNTER — Encounter (INDEPENDENT_AMBULATORY_CARE_PROVIDER_SITE_OTHER): Payer: Self-pay | Admitting: *Deleted

## 2024-11-08 ENCOUNTER — Encounter: Payer: Self-pay | Admitting: Family Medicine

## 2024-11-08 ENCOUNTER — Ambulatory Visit: Admitting: Family Medicine

## 2024-11-08 VITALS — BP 127/74 | HR 88 | Temp 97.7°F | Ht 71.25 in | Wt 273.0 lb

## 2024-11-08 DIAGNOSIS — Z85820 Personal history of malignant melanoma of skin: Secondary | ICD-10-CM

## 2024-11-08 DIAGNOSIS — I10 Essential (primary) hypertension: Secondary | ICD-10-CM | POA: Diagnosis not present

## 2024-11-08 DIAGNOSIS — F411 Generalized anxiety disorder: Secondary | ICD-10-CM | POA: Diagnosis not present

## 2024-11-08 DIAGNOSIS — R42 Dizziness and giddiness: Secondary | ICD-10-CM | POA: Diagnosis not present

## 2024-11-08 DIAGNOSIS — J449 Chronic obstructive pulmonary disease, unspecified: Secondary | ICD-10-CM

## 2024-11-08 NOTE — Patient Instructions (Signed)
 Consider vestibular rehab.  Referral placed.  Follow up in 6 months.

## 2024-11-12 DIAGNOSIS — R42 Dizziness and giddiness: Secondary | ICD-10-CM | POA: Insufficient documentation

## 2024-11-12 MED ORDER — BUPROPION HCL ER (SR) 150 MG PO TB12: 150.0000 mg | ORAL_TABLET | Freq: Two times a day (BID) | ORAL | 3 refills | Status: AC

## 2024-11-12 MED ORDER — AMLODIPINE BESY-BENAZEPRIL HCL 5-20 MG PO CAPS: 1.0000 | ORAL_CAPSULE | Freq: Every day | ORAL | 3 refills | Status: AC

## 2024-11-12 NOTE — Assessment & Plan Note (Signed)
 Stable

## 2024-11-12 NOTE — Assessment & Plan Note (Signed)
 Patient to consider vestibular rehab.

## 2024-11-12 NOTE — Progress Notes (Signed)
 Subjective:  Patient ID: Ronald Juarez, male    DOB: 1956/06/27  Age: 68 y.o. MRN: [id number]  CC:   Chief Complaint  Patient presents with   6 month follow up     Equilibrium has been off especially when tilting head back and looking up     HPI:  68 year old male presents for follow up.    HTN well controlled on Lotrel.   COPD stable.  Reports intermittent dizziness. Described as dysequilibrium. Occurs with abrupt head movements.  Has a history of melanoma. Needs to see a dermatologist at least annually. Will discuss referral today.  Patient Active Problem List   Diagnosis Date Noted   Dizziness 11/12/2024   Annual physical exam 05/26/2024   Prediabetes 01/02/2023   Hyperlipidemia 01/02/2023   History of total hip replacement, left 12/18/2020   Insomnia 12/18/2020   History of melanoma 06/14/2020   Venous stasis 09/24/2015   COPD mixed type (HCC) 08/27/2015   Essential hypertension, benign 03/09/2013   Generalized anxiety disorder 03/09/2013    Social Hx   Social History   Socioeconomic History   Marital status: Widowed    Spouse name: Not on file   Number of children: Not on file   Years of education: Not on file   Highest education level: Not on file  Occupational History   Not on file  Tobacco Use   Smoking status: Former    Current packs/day: 0.00    Types: Cigarettes    Quit date: 10/20/2011    Years since quitting: 13.0   Smokeless tobacco: Never  Vaping Use   Vaping status: Never Used  Substance and Sexual Activity   Alcohol use: Yes    Alcohol/week: 0.0 standard drinks of alcohol    Comment: occ   Drug use: No   Sexual activity: Not on file  Other Topics Concern   Not on file  Social History Narrative   Not on file   Social Drivers of Health   Financial Resource Strain: Not on file  Food Insecurity: Not on file  Transportation Needs: Not on file  Physical Activity: Not on file  Stress: Not on file  Social Connections: Not on file     Review of Systems Per HPI  Objective:  BP 127/74   Pulse 88   Temp 97.7 F (36.5 C)   Ht 5' 11.25 (1.81 m)   Wt 273 lb (123.8 kg)   SpO2 95%   BMI 37.81 kg/m      11/08/2024    7:59 AM 05/26/2024    8:43 AM 01/04/2024    8:51 AM  BP/Weight  Systolic BP 127 120 130  Diastolic BP 74 74 82  Wt. (Lbs) 273 268 277.2  BMI 37.81 kg/m2 37.12 kg/m2 38.39 kg/m2    Physical Exam Vitals and nursing note reviewed.  Constitutional:      General: He is not in acute distress.    Appearance: Normal appearance. He is obese.  HENT:     Head: Normocephalic and atraumatic.  Eyes:     General:        Right eye: No discharge.        Left eye: No discharge.     Conjunctiva/sclera: Conjunctivae normal.  Cardiovascular:     Rate and Rhythm: Normal rate and regular rhythm.  Pulmonary:     Effort: Pulmonary effort is normal.     Breath sounds: Normal breath sounds. No wheezing, rhonchi or rales.  Neurological:  Mental Status: He is alert.  Psychiatric:        Mood and Affect: Mood normal.        Behavior: Behavior normal.     Lab Results  Component Value Date   WBC 8.0 05/26/2024   HGB 16.5 05/26/2024   HCT 51.0 05/26/2024   PLT 218 05/26/2024   GLUCOSE 93 05/26/2024   CHOL 172 05/26/2024   TRIG 93 05/26/2024   HDL 49 05/26/2024   LDLCALC 106 (H) 05/26/2024   ALT 26 05/26/2024   AST 24 05/26/2024   NA 139 05/26/2024   K 5.2 05/26/2024   CL 101 05/26/2024   CREATININE 0.95 05/26/2024   BUN 18 05/26/2024   CO2 23 05/26/2024   TSH 2.480 05/07/2020   PSA 0.9 03/28/2015   INR 0.9 11/01/2020   HGBA1C 5.8 (H) 05/26/2024     Assessment & Plan:  Dizziness Assessment & Plan: Patient to consider vestibular rehab.   History of melanoma Assessment & Plan: Referring to dermatology for routine skin checks.  Orders: -     Ambulatory referral to Dermatology  Essential hypertension, benign Assessment & Plan: Stable. Continue Lotrel.  Orders: -     amLODIPine   Besy-Benazepril  HCl; Take 1 capsule by mouth daily.  Dispense: 90 capsule; Refill: 3  Generalized anxiety disorder -     buPROPion  HCl ER (SR); Take 1 tablet (150 mg total) by mouth 2 (two) times daily.  Dispense: 180 tablet; Refill: 3  COPD mixed type (HCC) Assessment & Plan: Stable.     Follow-up:  6 months.  Jacqulyn Ahle DO [facility]

## 2024-11-12 NOTE — Assessment & Plan Note (Signed)
Stable.  Continue Lotrel.

## 2024-11-12 NOTE — Assessment & Plan Note (Signed)
 Referring to dermatology for routine skin checks.

## 2024-11-25 ENCOUNTER — Ambulatory Visit: Admitting: Family Medicine

## 2024-12-19 ENCOUNTER — Telehealth: Payer: Self-pay | Admitting: Family Medicine

## 2024-12-19 ENCOUNTER — Other Ambulatory Visit: Payer: Self-pay

## 2024-12-19 MED ORDER — VALACYCLOVIR HCL 500 MG PO TABS: 500.0000 mg | ORAL_TABLET | Freq: Two times a day (BID) | ORAL | 0 refills | Status: AC

## 2024-12-19 NOTE — Telephone Encounter (Signed)
 Refill on Vatrex 500 mg  [facility]

## 2024-12-19 NOTE — Telephone Encounter (Signed)
 I refilled
